# Patient Record
Sex: Female | Born: 1935 | Race: White | Hispanic: No | Smoking: Former smoker
Health system: Southern US, Community
[De-identification: ages and names within clinical notes are randomized; demographics above are authoritative.]

## PROBLEM LIST (undated history)

## (undated) ENCOUNTER — Emergency Department (HOSPITAL_COMMUNITY): Admission: EM | Payer: Medicare Other | Source: Home / Self Care

## (undated) DIAGNOSIS — I517 Cardiomegaly: Secondary | ICD-10-CM

## (undated) DIAGNOSIS — R55 Syncope and collapse: Secondary | ICD-10-CM

## (undated) DIAGNOSIS — R42 Dizziness and giddiness: Secondary | ICD-10-CM

## (undated) DIAGNOSIS — K219 Gastro-esophageal reflux disease without esophagitis: Secondary | ICD-10-CM

## (undated) DIAGNOSIS — T4145XA Adverse effect of unspecified anesthetic, initial encounter: Secondary | ICD-10-CM

## (undated) DIAGNOSIS — I2510 Atherosclerotic heart disease of native coronary artery without angina pectoris: Secondary | ICD-10-CM

## (undated) DIAGNOSIS — J45909 Unspecified asthma, uncomplicated: Secondary | ICD-10-CM

## (undated) DIAGNOSIS — R0602 Shortness of breath: Secondary | ICD-10-CM

## (undated) DIAGNOSIS — J40 Bronchitis, not specified as acute or chronic: Secondary | ICD-10-CM

## (undated) DIAGNOSIS — G2581 Restless legs syndrome: Secondary | ICD-10-CM

## (undated) DIAGNOSIS — D649 Anemia, unspecified: Secondary | ICD-10-CM

## (undated) DIAGNOSIS — J189 Pneumonia, unspecified organism: Secondary | ICD-10-CM

## (undated) DIAGNOSIS — E7800 Pure hypercholesterolemia, unspecified: Secondary | ICD-10-CM

## (undated) DIAGNOSIS — R350 Frequency of micturition: Secondary | ICD-10-CM

## (undated) DIAGNOSIS — I10 Essential (primary) hypertension: Secondary | ICD-10-CM

## (undated) DIAGNOSIS — J449 Chronic obstructive pulmonary disease, unspecified: Secondary | ICD-10-CM

## (undated) DIAGNOSIS — M1990 Unspecified osteoarthritis, unspecified site: Secondary | ICD-10-CM

## (undated) DIAGNOSIS — T8859XA Other complications of anesthesia, initial encounter: Secondary | ICD-10-CM

## (undated) DIAGNOSIS — C801 Malignant (primary) neoplasm, unspecified: Secondary | ICD-10-CM

## (undated) DIAGNOSIS — H9190 Unspecified hearing loss, unspecified ear: Secondary | ICD-10-CM

## (undated) DIAGNOSIS — I739 Peripheral vascular disease, unspecified: Secondary | ICD-10-CM

## (undated) DIAGNOSIS — I6529 Occlusion and stenosis of unspecified carotid artery: Secondary | ICD-10-CM

## (undated) HISTORY — PX: CARDIAC CATHETERIZATION: SHX172

## (undated) HISTORY — PX: TONSILLECTOMY: SUR1361

## (undated) HISTORY — PX: APPENDECTOMY: SHX54

## (undated) HISTORY — DX: Atherosclerotic heart disease of native coronary artery without angina pectoris: I25.10

## (undated) HISTORY — DX: Cardiomegaly: I51.7

## (undated) HISTORY — DX: Occlusion and stenosis of unspecified carotid artery: I65.29

## (undated) HISTORY — PX: BACK SURGERY: SHX140

## (undated) HISTORY — PX: CHOLECYSTECTOMY: SHX55

## (undated) HISTORY — PX: ABDOMINAL HYSTERECTOMY: SHX81

## (undated) HISTORY — DX: Peripheral vascular disease, unspecified: I73.9

## (undated) HISTORY — PX: FOOT SURGERY: SHX648

## (undated) HISTORY — PX: INNER EAR SURGERY: SHX679

## (undated) HISTORY — PX: COLONOSCOPY: SHX174

---

## 2003-01-11 ENCOUNTER — Encounter: Payer: Self-pay | Admitting: Neurosurgery

## 2003-01-11 ENCOUNTER — Encounter: Admission: RE | Admit: 2003-01-11 | Discharge: 2003-01-11 | Payer: Self-pay | Admitting: Neurosurgery

## 2003-01-25 ENCOUNTER — Encounter: Admission: RE | Admit: 2003-01-25 | Discharge: 2003-01-25 | Payer: Self-pay | Admitting: Neurosurgery

## 2003-01-25 ENCOUNTER — Encounter: Payer: Self-pay | Admitting: Neurosurgery

## 2004-08-10 HISTORY — PX: CORONARY ANGIOPLASTY: SHX604

## 2007-08-11 DIAGNOSIS — R55 Syncope and collapse: Secondary | ICD-10-CM

## 2007-08-11 HISTORY — DX: Syncope and collapse: R55

## 2009-03-12 ENCOUNTER — Ambulatory Visit (HOSPITAL_COMMUNITY): Admission: RE | Admit: 2009-03-12 | Discharge: 2009-03-13 | Payer: Self-pay | Admitting: Orthopedic Surgery

## 2009-03-12 ENCOUNTER — Encounter: Admission: RE | Admit: 2009-03-12 | Discharge: 2009-03-12 | Payer: Self-pay | Admitting: Orthopedic Surgery

## 2009-03-13 ENCOUNTER — Emergency Department (HOSPITAL_COMMUNITY): Admission: EM | Admit: 2009-03-13 | Discharge: 2009-03-13 | Payer: Self-pay | Admitting: Emergency Medicine

## 2009-06-19 ENCOUNTER — Ambulatory Visit (HOSPITAL_BASED_OUTPATIENT_CLINIC_OR_DEPARTMENT_OTHER): Admission: RE | Admit: 2009-06-19 | Discharge: 2009-06-19 | Payer: Self-pay | Admitting: Orthopedic Surgery

## 2010-08-10 HISTORY — PX: EYE SURGERY: SHX253

## 2010-11-12 LAB — POCT HEMOGLOBIN-HEMACUE: Hemoglobin: 12.7 g/dL (ref 12.0–15.0)

## 2010-11-15 LAB — BASIC METABOLIC PANEL
BUN: 16 mg/dL (ref 6–23)
Calcium: 9.3 mg/dL (ref 8.4–10.5)
GFR calc non Af Amer: >60 mL/min (ref >60)
Glucose, Bld: 108 mg/dL — ABNORMAL HIGH (ref 70–99)
Sodium: 140 mEq/L (ref 135–145)

## 2011-04-29 ENCOUNTER — Other Ambulatory Visit: Payer: Self-pay | Admitting: Orthopedic Surgery

## 2011-04-29 ENCOUNTER — Other Ambulatory Visit (HOSPITAL_COMMUNITY): Payer: Self-pay | Admitting: Orthopedic Surgery

## 2011-04-29 ENCOUNTER — Encounter (HOSPITAL_COMMUNITY): Payer: Medicare Other

## 2011-04-29 ENCOUNTER — Ambulatory Visit (HOSPITAL_COMMUNITY)
Admission: RE | Admit: 2011-04-29 | Discharge: 2011-04-29 | Disposition: A | Discharge status: Home / Self Care | Payer: Medicare Other | Source: Ambulatory Visit | Attending: Orthopedic Surgery | Admitting: Orthopedic Surgery

## 2011-04-29 DIAGNOSIS — J45909 Unspecified asthma, uncomplicated: Secondary | ICD-10-CM | POA: Insufficient documentation

## 2011-04-29 DIAGNOSIS — M1710 Unilateral primary osteoarthritis, unspecified knee: Secondary | ICD-10-CM | POA: Insufficient documentation

## 2011-04-29 DIAGNOSIS — J8410 Pulmonary fibrosis, unspecified: Secondary | ICD-10-CM | POA: Insufficient documentation

## 2011-04-29 DIAGNOSIS — Z01812 Encounter for preprocedural laboratory examination: Secondary | ICD-10-CM | POA: Insufficient documentation

## 2011-04-29 DIAGNOSIS — I10 Essential (primary) hypertension: Secondary | ICD-10-CM | POA: Insufficient documentation

## 2011-04-29 DIAGNOSIS — Z01818 Encounter for other preprocedural examination: Secondary | ICD-10-CM

## 2011-04-29 DIAGNOSIS — R599 Enlarged lymph nodes, unspecified: Secondary | ICD-10-CM | POA: Insufficient documentation

## 2011-04-29 LAB — CBC
MCH: 28.3 pg (ref 26.0–34.0)
MCHC: 33.3 g/dL (ref 30.0–36.0)
Platelets: 246 10*3/uL (ref 150–400)
RDW: 13.3 % (ref 11.5–15.5)

## 2011-04-29 LAB — COMPREHENSIVE METABOLIC PANEL
Albumin: 3.8 g/dL (ref 3.5–5.2)
Alkaline Phosphatase: 140 U/L — ABNORMAL HIGH (ref 39–117)
BUN: 17 mg/dL (ref 6–23)
Calcium: 9.7 mg/dL (ref 8.4–10.5)
Creatinine, Ser: 0.77 mg/dL (ref 0.50–1.10)
GFR calc Af Amer: >60 mL/min (ref >60)
Glucose, Bld: 73 mg/dL (ref 70–99)
Total Protein: 7.1 g/dL (ref 6.0–8.3)

## 2011-04-29 LAB — URINALYSIS, ROUTINE W REFLEX MICROSCOPIC
Glucose, UA: NEGATIVE mg/dL
Ketones, ur: NEGATIVE mg/dL
Leukocytes, UA: NEGATIVE
Nitrite: NEGATIVE
Specific Gravity, Urine: 1.011 (ref 1.005–1.030)
pH: 7 (ref 5.0–8.0)

## 2011-04-29 LAB — PROTIME-INR
INR: 1.05 (ref 0.00–1.49)
Prothrombin Time: 13.9 seconds (ref 11.6–15.2)

## 2011-04-29 LAB — SURGICAL PCR SCREEN: MRSA, PCR: POSITIVE — AB

## 2011-04-30 ENCOUNTER — Other Ambulatory Visit: Payer: Self-pay | Admitting: Orthopedic Surgery

## 2011-04-30 LAB — NO BLOOD PRODUCTS

## 2011-05-04 ENCOUNTER — Inpatient Hospital Stay (HOSPITAL_COMMUNITY)
Admission: RE | Admit: 2011-05-04 | Discharge: 2011-05-07 | DRG: 470 | Disposition: A | Discharge status: Skilled Nursing Facility | Payer: Medicare Other | Source: Ambulatory Visit | Attending: Orthopedic Surgery | Admitting: Orthopedic Surgery

## 2011-05-04 DIAGNOSIS — Z531 Procedure and treatment not carried out because of patient's decision for reasons of belief and group pressure: Secondary | ICD-10-CM

## 2011-05-04 DIAGNOSIS — E7800 Pure hypercholesterolemia, unspecified: Secondary | ICD-10-CM | POA: Diagnosis present

## 2011-05-04 DIAGNOSIS — H9319 Tinnitus, unspecified ear: Secondary | ICD-10-CM | POA: Diagnosis present

## 2011-05-04 DIAGNOSIS — H9190 Unspecified hearing loss, unspecified ear: Secondary | ICD-10-CM | POA: Diagnosis present

## 2011-05-04 DIAGNOSIS — I2510 Atherosclerotic heart disease of native coronary artery without angina pectoris: Secondary | ICD-10-CM | POA: Diagnosis present

## 2011-05-04 DIAGNOSIS — I10 Essential (primary) hypertension: Secondary | ICD-10-CM | POA: Diagnosis present

## 2011-05-04 DIAGNOSIS — Z7382 Dual sensory impairment: Secondary | ICD-10-CM

## 2011-05-04 DIAGNOSIS — H547 Unspecified visual loss: Secondary | ICD-10-CM | POA: Diagnosis present

## 2011-05-04 DIAGNOSIS — L259 Unspecified contact dermatitis, unspecified cause: Secondary | ICD-10-CM | POA: Diagnosis present

## 2011-05-04 DIAGNOSIS — K5730 Diverticulosis of large intestine without perforation or abscess without bleeding: Secondary | ICD-10-CM | POA: Diagnosis present

## 2011-05-04 DIAGNOSIS — M1710 Unilateral primary osteoarthritis, unspecified knee: Principal | ICD-10-CM | POA: Diagnosis present

## 2011-05-04 DIAGNOSIS — J45909 Unspecified asthma, uncomplicated: Secondary | ICD-10-CM | POA: Diagnosis present

## 2011-05-04 DIAGNOSIS — D62 Acute posthemorrhagic anemia: Secondary | ICD-10-CM | POA: Diagnosis not present

## 2011-05-04 DIAGNOSIS — Z9861 Coronary angioplasty status: Secondary | ICD-10-CM

## 2011-05-04 DIAGNOSIS — M810 Age-related osteoporosis without current pathological fracture: Secondary | ICD-10-CM | POA: Diagnosis present

## 2011-05-04 DIAGNOSIS — Z79899 Other long term (current) drug therapy: Secondary | ICD-10-CM

## 2011-05-04 DIAGNOSIS — Z01812 Encounter for preprocedural laboratory examination: Secondary | ICD-10-CM

## 2011-05-04 DIAGNOSIS — Z780 Asymptomatic menopausal state: Secondary | ICD-10-CM

## 2011-05-04 DIAGNOSIS — K219 Gastro-esophageal reflux disease without esophagitis: Secondary | ICD-10-CM | POA: Diagnosis present

## 2011-05-05 LAB — CBC
MCH: 28 pg (ref 26.0–34.0)
Platelets: 184 10*3/uL (ref 150–400)
RBC: 3.18 MIL/uL — ABNORMAL LOW (ref 3.87–5.11)
RDW: 12.9 % (ref 11.5–15.5)

## 2011-05-05 LAB — BASIC METABOLIC PANEL
CO2: 32 mEq/L (ref 19–32)
Calcium: 8.6 mg/dL (ref 8.4–10.5)
GFR calc Af Amer: >60 mL/min (ref >60)
GFR calc non Af Amer: >60 mL/min (ref >60)
Sodium: 138 mEq/L (ref 135–145)

## 2011-05-06 LAB — BASIC METABOLIC PANEL
CO2: 33 mEq/L — ABNORMAL HIGH (ref 19–32)
Chloride: 102 mEq/L (ref 96–112)
Potassium: 3.6 mEq/L (ref 3.5–5.1)
Sodium: 140 mEq/L (ref 135–145)

## 2011-05-06 LAB — CBC
MCV: 85.1 fL (ref 78.0–100.0)
Platelets: 176 10*3/uL (ref 150–400)
RBC: 3.23 MIL/uL — ABNORMAL LOW (ref 3.87–5.11)
WBC: 9.1 10*3/uL (ref 4.0–10.5)

## 2011-05-06 NOTE — Op Note (Signed)
NAMEZORINA, MALLIN NO.:  [id number]  MEDICAL RECORD NO.:  [id number]  LOCATION:  1609                         FACILITY:  [facility]  PHYSICIAN:  Ollen Gross, M.D.    DATE OF BIRTH:  December 02, 1929  DATE OF PROCEDURE: DATE OF DISCHARGE:                              OPERATIVE REPORT   PREOPERATIVE DIAGNOSIS:  Osteoarthritis, left knee.  POSTOPERATIVE DIAGNOSIS:  Osteoarthritis, left knee.  PROCEDURE:  Left total knee arthroplasty.  SURGEON:  Ollen Gross, M.D.  ASSISTANT:  Alexzandrew L. Perkins, P.A.C.  ANESTHESIA:  Spinal.  ESTIMATED BLOOD LOSS:  Minimal.  DRAIN:  Hemovac x1.  TOURNIQUET TIME:  34 minutes at 300 mmHg.  COMPLICATIONS:  None.  CONDITION:  Stable, recovering.  BRIEF CLINICAL NOTE:  Ms. Gramajo is a 77 year old female with advanced end-stage arthritis of the left knee with progressively worsening pain and dysfunction.  She has failed nonoperative management and presents for a left total knee arthroplasty.  She has bone-on-bone arthritis in the medial and patellofemoral compartments and knee with a varus deformity.  She has failed injections and analgesics.  PROCEDURE IN DETAIL:  After successful administration of spinal anesthetic, a tourniquet is placed high on her left thigh and her left lower extremity was prepped and draped in the usual sterile fashion. Extremities were wrapped in Esmarch, knee flexed, tourniquet inflated to 300 mmHg.  Midline incision was made with 10 blade through subcutaneous tissue to the level of the extensor mechanism.  A fresh blade is used to make a medial parapatellar arthrotomy.  Soft tissue on the proximal medial tibia, subperiosteally elevated to the joint line with the knife and into the semimembranosus bursa with a Cobb elevator.  Soft tissue laterally was elevated with attention being paid to avoid patellar tendon on tibial tubercle.  Patella was everted, knee flexed 90 degrees, and ACL and PCL  removed.  Drill was used create a starting hole in the distal femur, and the canal was thoroughly irrigated.  The 5-degree left valgus alignment guide is placed, and the distal femoral cutting block is pinned to remove 10 mm off the distal femur.  Distal femoral resection is made with an oscillating saw.  The tibia subluxed forward and the menisci removed.  Extramedullary tibial alignment guide is placed referencing proximally at the medial aspect of the tibial tubercle and distally along the second metatarsal axis and tibial crest.  The block is pinned to remove 2 mm off the more deficient medial side.  Tibial resection is made with an oscillating saw.  Size 2.5 is most appropriate tibial component and the proximal tibia was prepared with the modular drill and keel punch for the size 2.5.  Femoral sizing guide is placed, size 2.5 is most appropriate on the femur.  The rotation is marked off the epicondylar axis and confirmed by creating a rectangular flexion gap at 90 degrees.  The block is pinned in this rotation, and the anterior-posterior and chamfer cuts are made. Intercondylar block is placed and that cut was made.  Trial size 2.5 posterior stabilized femoral component was placed.  A 12.5 mm posterior stabilized rotating platform insert trial was placed.  With the 12.5 full extensions  achieved with excellent varus-valgus and anterior- posterior balance throughout full range of motion.  The patella was everted and thickness measured to be 21 mm.  Freehand resection was taken 12 mm, 35 template is placed, lug holes were drilled, trial patella was placed and it tracks normally.  Osteophytes were removed off the posterior femur with the trial in place.  All trials were removed, and the cut bone surfaces were prepared with pulsatile lavage.  Cement was mixed and once ready for implantation, a size 2.5 mobile bearing tibial tray, size 2.5 posterior stabilized femur, and 35 patella  were cemented into place.  The patella was held with a clamp.  Trial 12.5 mm inserts were placed, knee held in full extension, all extruded cement removed.  The cement is fully hardened, then a permanent 12.5 mm posterior stabilized rotating platform insert is placed in the tibial tray.  Wound was copiously irrigated with saline solution and the arthrotomy closed over Hemovac drain with interrupted #1 PDS.  Flexion against gravity is 135 degrees and patella tracks normally.  The tourniquet was released, total time of 34 minutes.  Subcu was closed with interrupted 2-0 Vicryl and subcuticular running 4-0 Monocryl. Catheter for the Marcaine pain pump was placed, and the pump was initiated.  Incision was cleaned and dried, and Steri-Strips and a bulky sterile dressing applied.  She was then awakened and transported to recovery in stable condition.  Please note that it was a medical necessity for a surgical assistant for this procedure in order to perform it in a safe and expeditious manner. Surgical assistant was providing retraction to the ligaments and vital neurovascular structures, and also positioning the leg to allow for anatomic placement of the prosthesis.     Ollen Gross, M.D.     FA/MEDQ  D:  05/04/2011  T:  05/04/2011  Job:  [id number]  Electronically Signed by Ollen Gross M.D. on 05/06/2011 10:34:54 AM

## 2011-05-11 HISTORY — PX: JOINT REPLACEMENT: SHX530

## 2011-05-20 NOTE — H&P (Signed)
[name], [name] NO.:  [id number]  MEDICAL RECORD NO.:  [id number]  LOCATION:  1609                         FACILITY:  [facility]  PHYSICIAN:  Ollen Gross, M.D.    DATE OF BIRTH:  1930-06-02  DATE OF ADMISSION:  05/04/2011 DATE OF DISCHARGE:                             HISTORY & PHYSICAL   CHIEF COMPLAINT:  Left knee pain.  HISTORY OF PRESENT ILLNESS:  The patient is a 77 year old female who has been seen by Dr. Lequita Halt for ongoing left knee pain.  She has been treated conservatively for her arthritis in the left knee for quite some time now.  She has pain in both knees, but the left is more symptomatic and problematic than the right.  She has undergone a series of Synvisc injections, which did not help.  X-rays at this point show that she is end-stage and bone-on-bone.  They are worse on the left than the right. It is felt she would benefit from undergoing surgical intervention. Risks and benefits have been discussed.  She elected to proceed with surgery.  ALLERGIES:  No known drug allergies.  CURRENT MEDICATIONS:  Torsemide, potassium chloride, metoprolol, ropinirole, Singulair, Nexium, generic Ambien, atorvastatin, oxycodone, ciprofloxacin, and Nitrostat.  PAST MEDICAL HISTORY: 1. Impaired vision, wears contacts. 2. Impaired hearing, uses hearing aids. 3. Tinnitus. 4. Occasional vertigo. 5. Hypertension. 6. Coronary arterial disease, status post stenting. 7. Hypercholesterolemia. 8. Varicose veins. 9. Reflux disease. 10.Diverticulosis. 11.Osteoporosis. 12.Postmenopausal. 13.Eczema. 14.Childhood illnesses of measles.  PAST SURGICAL HISTORY:  Hysterectomy in 1974, gallbladder surgery in 1990, hammertoes in right foot in November 2009 and again in 2010, and bilateral ear surgery in around 2000.  FAMILY HISTORY:  Mother suicide at age 59.  Father with fall, job accident.  SOCIAL HISTORY:  Widowed.  Retired.  Past smoker, quit in 1974.   No alcohol.  Lives alone.  She does have a caregiver lined up, but also she would like to look into a rehab facility first.  She would like to look into the [facility] and [facility].  She is Jehovah's Witnesses and will not accept any blood products.  REVIEW OF SYSTEMS:  GENERAL:  No fever, chills, or night sweats.  NEURO: No seizure, syncope, or paralysis.  She does have ringing in her ears and some hearing loss.  RESPIRATORY:  No shortness of breath, productive cough, or hemoptysis.  CARDIOVASCULAR:  No chest pain, angina, or orthopnea.  GI:  No nausea, vomiting, diarrhea, or constipation.  GU: No dysuria, hematuria, or discharge.  MUSCULOSKELETAL:  Knee pain.  PHYSICAL EXAMINATION:  VITAL SIGNS:  Pulse 56, respirations 14, blood pressure 114/56. GENERAL:  A 77 year old white female, well nourished, well developed, in no acute distress.  She is alert, oriented, cooperative, pleasant.  Good historian. HEENT:  Normocephalic, atraumatic.  Pupils are round and reactive.  EOMs intact. NECK:  Supple with a trace right carotid bruit. CHEST:  Clear. HEART:  Bradycardic rhythm.  Pulse around 56, regular rhythm, otherwise no murmur.  S1, S2 noted. ABDOMEN:  Soft, nontender.  Bowel sounds present. RECTAL/BREASTS/GENITALIA:  Not done, not pertinent to present illness. EXTREMITIES:  Left knee, motor function is intact, sensation is  intact, noted to have crepitus on passive range of motion.  IMPRESSION:  Osteoarthritis, left knee.  PLAN:  The patient admitted to [facility] to undergo a left total knee replacement arthroplasty.  Surgery will be performed by Dr. Ollen Gross.     Alexzandrew L. Julien Girt, P.A.C.   ______________________________ Ollen Gross, M.D.    ALP/MEDQ  D:  05/06/2011  T:  05/07/2011  Job:  [id number]  cc:   Gigi Gin _____, NP  [facility]  Electronically Signed by Patrica Duel P.A.C. on 05/07/2011 11:20:52  AM Electronically Signed by Ollen Gross M.D. on 05/20/2011 11:17:00 AM

## 2011-05-20 NOTE — Discharge Summary (Signed)
NAMEKHILYNN, BORNTREGER NO.:  [id number]  MEDICAL RECORD NO.:  [id number]  LOCATION:  1609                         FACILITY:  [facility]  PHYSICIAN:  Ollen Gross, M.D.    DATE OF BIRTH:  [birth year]  DATE OF ADMISSION:  05/04/2011 DATE OF DISCHARGE:  05/07/2011                        DISCHARGE SUMMARY - REFERRING   ADMITTING DIAGNOSES: 1. Osteoarthritis, left knee. 2. Impaired vision, wears contacts. 3. Impaired hearing,  uses hearing aids. 4. Tinnitus. 5. Occasional vertigo. 6. Hypertension. 7. Coronary arterial disease status post cardiac stenting. 8. Hypercholesterolemia. 9. Varicose veins. 10.Reflux disease. 11.Diverticulosis. 12.Osteoporosis. 13.Postmenopausal. 14.Eczema. 15.Childhood illnesses of measles.  DISCHARGE DIAGNOSES: 1. Osteoarthritis, left knee status post left total knee replacement     arthroplasty. 2. Postoperative acute blood loss anemia, did not require transfusion. 3. Impaired vision, wears contacts. 4. Impaired hearing,  uses hearing aids. 5. Tinnitus. 6. Occasional vertigo. 7. Hypertension. 8. Coronary arterial disease status post cardiac stenting. 9. Hypercholesterolemia. 10.Varicose veins. 11.Reflux disease. 12.Diverticulosis. 13.Osteoporosis. 14.Postmenopausal. 15.Eczema. 16.Childhood illnesses of measles.  PROCEDURE:  May 04, 2011, left total knee.  SURGEON:  Dr. Homero Fellers Radley Teston.  ASSISTANT:  Alexzandrew L. Perkins, P.A.C.  ANESTHESIA:  Spinal anesthesia.  TOURNIQUET TIME:  34 minutes.  CONSULTS:  None.  BRIEF HISTORY:  [name] is a 77 year old female with advanced arthritis of left knee with progressive worsening pain and dysfunction, failed nonoperative management, now presents for a left total knee.  She has bone-on-bone arthritis and failed injection and analgesics.  LABORATORY DATA:  Preop CBC showed a hemoglobin 13.0, hematocrit 39.0, white cell count 6.4, platelets 246.  PT/INR 13.9 and 1.05  with a PTT of 42.  Chem panel on admission slightly elevated CO2 of 33, slightly elevated alk phos of 140.  Remaining Chem panel within normal limits. Preop UA negative.  Nasal swabs were positive for staph aureus and positive for MRSA.  Serial CBCs followed for 2 days.  Hemoglobin dropped down to 8.9 and stabilized and was last noted at 9.0 and 27.5.  Serial BMETs were followed for 48 hours.  Electrolytes remained within normal limits.  Chest x-ray on April 29, 2011, old granulomatous disease, otherwise no significant abnormalities.  EKG, April 2012, sinus bradycardia.  HOSPITAL COURSE:  The patient admitted to [facility], taken to the OR, underwent the above-stated procedure without complication.  The patient tolerated procedure well, later transferred to recovery room in the orthopedic floor, started on p.o. and IV analgesic pain control following surgery.  She had a fair amount of pain through the night, but still a little bit better on the morning of day #1.  She had excellent urinary output.  Hemoglobin was down to 8.9.  She was asymptomatic with this.  She was started back on all of her home meds.  She was on oxycodone preoperatively, so we increased the dose of the oral oxycodone postoperatively to give better pain control.  This actually helped her situation.  She was otherwise doing pretty well, started getting up out of bed on day #1 and by day #2, she was already walking in the hallway, walking about 10 feet.  We changed the dressing on day #2 and weaned over  to p.o. meds.  Incision looked good.  No signs of infection. Continue to work with therapy each day.  We got social work involved. She wanted to look into the skilled facility over in [facility] where she was from.  They are working on bed availability and  when she was seen on Thursday, on May 07, 2011, it was felt that as long as the bed was available, she was doing fine medically from  postoperative standpoint.  She will be transferred over that time.  DISCHARGE/PLAN: 1. The patient will be transferred over to [facility]. 2. Discharge diagnoses, please see above. 3. Discharge medications, current medications at the time of transfer     include:     a.     Xarelto 10 mg p.o. daily for 3 weeks, then discontinue the      Xarelto.     b.     Colace 100 mg p.o. b.i.d.     c.     Singulair 10 mg p.o. at bedtime.     d.     ReQuip 2 mg at bedtime.     e.     Potassium chloride 20 mEq 1 tablet p.o. b.i.d.     f.     Toprol-XL 25 mg at bedtime.     g.     Demadex 20 mg twice a day.     h.     Meclizine 25 mg p.o. t.i.d.     i.     Atorvastatin 10 mg at bedtime.     j.     Nu-Iron 150 mg p.o. b.i.d. for 3 weeks, then go to once a      day.     k.     Nexium 40 mg every morning.     l.     Robaxin 500 mg p.o. every 6-8 hours p.r.n. spasm.     m.     Tylenol 325 one or two every 4-6 hours needed for mild pain,      temperature or headache.     n.     Combivent inhaler 2 puffs every 4 hours p.r.n.     o.     Oxycodone 10 mg one or two every 4-6 hours as needed for      moderate pain.  DIET:  Heart-healthy diet.  ACTIVITY:  She is weightbearing as tolerated, total knee protocol. Continue gait training, ambulation, ADLs, range of motion, strengthening exercises to the left knee.  She may start showering; however, do not submerge incision under water.  FOLLOWUP:  She is to follow up with Dr. Lequita Halt in the office in approximately 2 weeks from the date of surgery.  Please contact the office at [phone] and help arrange appointment for followup care of this patient.  DISPOSITION:  Plan is to go to the [facility]  CONDITION ON DISCHARGE:  Improving at time of dictation.     Alexzandrew L. Julien Girt, P.A.C.   ______________________________ Ollen Gross, M.D.    ALP/MEDQ  D:  05/07/2011  T:  05/07/2011  Job:  [id number]  cc:   [facility]  [facility]  Electronically Signed by Patrica Duel P.A.C. on 05/07/2011 11:20:57 AM Electronically Signed by Ollen Gross M.D. on 05/20/2011 11:16:57 AM

## 2012-03-22 ENCOUNTER — Encounter (HOSPITAL_COMMUNITY): Payer: Self-pay | Admitting: Respiratory Therapy

## 2012-03-25 NOTE — Pre-Procedure Instructions (Signed)
20 TICIA VIRGO  03/25/2012   Your procedure is scheduled on:  Wednesday April 06, 2012.  Report to [facility] at 0630 AM.  Call this number if you have problems the morning of surgery: [phone]   Remember:   Do not eat food or drink:After Midnight.    Take these medicines the morning of surgery with A SIP OF WATER: Albuterol inhaler if needed for shortness of breath, Esomeprazole (Nexium), Oxycodone if needed for pain, Pilocarpine (Salagen)   Do not wear jewelry, make-up or nail polish.  Do not wear lotions, powders, or perfumes.   Do not shave 48 hours prior to surgery. .  Do not bring valuables to the hospital.  Contacts, dentures or bridgework may not be worn into surgery.  Leave suitcase in the car. After surgery it may be brought to your room.  For patients admitted to the hospital, checkout time is 11:00 AM the day of discharge.   Patients discharged the day of surgery will not be allowed to drive home.  Name and phone number of your driver:   Special Instructions: CHG Shower Use Special Wash: 1/2 bottle night before surgery and 1/2 bottle morning of surgery.   Please read over the following fact sheets that you were given: Pain Booklet, Coughing and Deep Breathing, Blood Transfusion Information, MRSA Information and Surgical Site Infection Prevention

## 2012-03-28 ENCOUNTER — Encounter (HOSPITAL_COMMUNITY)
Admission: RE | Admit: 2012-03-28 | Discharge: 2012-03-28 | Disposition: A | Discharge status: Home / Self Care | Payer: Medicare Other | Source: Ambulatory Visit | Attending: Orthopedic Surgery | Admitting: Orthopedic Surgery

## 2012-03-28 ENCOUNTER — Encounter (HOSPITAL_COMMUNITY)
Admission: RE | Admit: 2012-03-28 | Discharge: 2012-03-28 | Disposition: A | Discharge status: Home / Self Care | Payer: Medicare Other | Source: Ambulatory Visit | Attending: Physician Assistant | Admitting: Physician Assistant

## 2012-03-28 ENCOUNTER — Encounter (HOSPITAL_COMMUNITY): Payer: Self-pay

## 2012-03-28 HISTORY — DX: Gastro-esophageal reflux disease without esophagitis: K21.9

## 2012-03-28 HISTORY — DX: Anemia, unspecified: D64.9

## 2012-03-28 HISTORY — DX: Restless legs syndrome: G25.81

## 2012-03-28 HISTORY — DX: Unspecified hearing loss, unspecified ear: H91.90

## 2012-03-28 HISTORY — DX: Unspecified asthma, uncomplicated: J45.909

## 2012-03-28 HISTORY — DX: Frequency of micturition: R35.0

## 2012-03-28 HISTORY — DX: Pure hypercholesterolemia, unspecified: E78.00

## 2012-03-28 HISTORY — DX: Dizziness and giddiness: R42

## 2012-03-28 HISTORY — DX: Unspecified osteoarthritis, unspecified site: M19.90

## 2012-03-28 HISTORY — DX: Other complications of anesthesia, initial encounter: T88.59XA

## 2012-03-28 HISTORY — DX: Syncope and collapse: R55

## 2012-03-28 HISTORY — DX: Chronic obstructive pulmonary disease, unspecified: J44.9

## 2012-03-28 HISTORY — DX: Adverse effect of unspecified anesthetic, initial encounter: T41.45XA

## 2012-03-28 HISTORY — DX: Pneumonia, unspecified organism: J18.9

## 2012-03-28 HISTORY — DX: Bronchitis, not specified as acute or chronic: J40

## 2012-03-28 HISTORY — DX: Essential (primary) hypertension: I10

## 2012-03-28 HISTORY — DX: Shortness of breath: R06.02

## 2012-03-28 LAB — CBC
HCT: 36.6 % (ref 36.0–46.0)
MCV: 83.6 fL (ref 78.0–100.0)
RBC: 4.38 MIL/uL (ref 3.87–5.11)
RDW: 13 % (ref 11.5–15.5)
WBC: 5.9 10*3/uL (ref 4.0–10.5)

## 2012-03-28 LAB — PROTIME-INR
INR: 1 (ref 0.00–1.49)
Prothrombin Time: 13.4 seconds (ref 11.6–15.2)

## 2012-03-28 LAB — COMPREHENSIVE METABOLIC PANEL
AST: 21 U/L (ref 0–37)
Albumin: 3.7 g/dL (ref 3.5–5.2)
Alkaline Phosphatase: 146 U/L — ABNORMAL HIGH (ref 39–117)
BUN: 22 mg/dL (ref 6–23)
CO2: 33 mEq/L — ABNORMAL HIGH (ref 19–32)
Chloride: 99 mEq/L (ref 96–112)
Creatinine, Ser: 1 mg/dL (ref 0.50–1.10)
GFR calc non Af Amer: 52 mL/min — ABNORMAL LOW (ref >90)
Potassium: 4 mEq/L (ref 3.5–5.1)
Total Bilirubin: 0.3 mg/dL (ref 0.3–1.2)

## 2012-03-28 LAB — SURGICAL PCR SCREEN: Staphylococcus aureus: POSITIVE — AB

## 2012-03-28 NOTE — H&P (Signed)
[name] 03/28/2012 8:51 AM Location: [facility] Patient #: [id number] DOB: [birth year] Married / Language: [place] / Race: White Female   History of Present Illness(Robyn Helbling Dierdre Highman, [name]; 03/28/2012 8:52 AM) The patient is a 78 year old female who comes in today for a preoperative History and Physical. The patient is scheduled for a XLIF L3-4 with poterior instrumentation for a grade 3 degenerative slip with stenosis L3-4 to be performed by Dr. Debria Garret D. Shon Baton, MD at [facility] on Wednesday, April 06, 2012 at 0830 . Please see the Harris record for complete dictated history and physical.    Allergies(Robyn Ode J Benton Tooker, [name]; 03/28/2012 11:17 AM) Iodinated Contrast   Social History([name]; 03/28/2012 11:18 AM) Tobacco use. Never smoker. Alcohol use. never consumed alcohol Children. 2 Current work status. 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 no Exercise. Exercises never Illicit drug use. no Living situation. Lives alone. live alone Marital status. Widowed. widowed Pain Contract. no Tobacco / smoke exposure. no No alcohol use   Medication History([name]; 03/28/2012 11:20 AM) Percocet (10-325MG  Tablet, 1 Oral three times daily, as needed, Taken starting 07/29/2011) Active. Norco (10-325MG  Tablet, 1 Tablet Oral four times daily, as needed, Taken starting 07/22/2011) Active. Methocarbamol (500MG  Tablet, 1 Oral every six hours, as needed) Active. Potassium Chloride CR ( Tablet ER, 2 Oral daily) Active. Robaxin (500MG  Tablet, Oral) Active. Toprol XL (25MG  Tablet ER 24HR, Oral) Active. Fish Oil (1000MG  Capsule, 4 Oral daily) Active. Torsemide (20MG  Tablet, 1 Oral two times daily) Active. NexIUM (40MG  Packet, 1 Oral daily) Active. Singulair (10MG  Tablet, 1 Oral at bedtime) Active. ROPINIRole HCl (1MG  Tablet, 2 Oral at bedtime) Active. Lipitor (10MG  Tablet, 1 Oral daily)  Active. Zolpidem Tartrate (10MG  Tablet, 1 Oral daily) Active.   Past Surgical History(Robyn Neider J [facility], [name]; 03/28/2012 10:32 AM) Hysterectomy (not due to cancer) - Complete. 1974 Cholecystectomy. 1995 Cataract Extraction-Bilateral. 05/2011 and 06/2011 Hammer toe Right. 2009 Knee Replacement, Total. Left. 2012 Dr. Despina Hick   Other Problems(Robyn Galik J [facility], [name]; 03/28/2012 10:38 AM) Asthma Hypertension Heart disease Diverticulitis Of Colon   Review of Systems(Robyn Dumire J [facility], [name]; 03/28/2012 11:25 AM) General:Not Present- Chills, Fever, Night Sweats, Appetite Loss, Fatigue, Feeling sick, Weight Gain and Weight Loss. Skin:Not Present- Itching, Rash, Skin Color Changes, Ulcer, Psoriasis and Change in Hair or Nails. HEENT:Present- Hearing problems. Not Present- Sensitivity to light, Nose Bleed and Ringing in the Ears. Neck:Not Present- Swollen Glands and Neck Mass. Respiratory:Not Present- Snoring, Chronic Cough, Bloody sputum and Dyspnea. Cardiovascular:Present- Shortness of Breath and Leg Cramps. Not Present- Chest Pain, Swelling of Extremities and Palpitations. Gastrointestinal:Present- Heartburn. Not Present- Bloody Stool, Abdominal Pain, Vomiting, Nausea and Incontinence of Stool. Female Genitourinary:Present- Nocturia. Not Present- Blood in Urine, Menstrual Irregularities, Frequency and Incontinence. Musculoskeletal:Present- Muscle Pain, Joint Stiffness, Joint Pain and Back Pain. Not Present- Muscle Weakness and Joint Swelling. Neurological:Present- Tingling and Numbness. Not Present- Burning, Tremor, Headaches and Dizziness. Psychiatric:Not Present- Anxiety, Depression and Memory Loss. Endocrine:Present- Excessive Thirst. Not Present- Cold Intolerance, Heat Intolerance and Excessive hunger. Hematology:Not Present- Abnormal Bleeding, Anemia, Blood Clots and Easy Bruising.   Vitals(Robyn More J Frederica Chrestman, [name]; 03/28/2012 10:17 AM) 03/28/2012 10:17 AM Weight: 150 lb  Height: 60 in Body Surface Area: 1.7 m Body Mass Index: 29.29 kg/m Pulse: 65 (Regular) BP: 153/64 (Sitting, Left Arm, Standard)    Physical Exam([name] Lemme J Abhay Godbolt, [name]; 03/28/2012 11:05 AM) The physical exam findings are as follows:   General General Appearance- pleasant. Not in acute distress. Orientation-  Oriented X3. Build & Nutrition- Well nourished and Well developed. Posture- Normal posture. Gait- Slow and cautious (actually presents in a wheelchair). Mental Status- Alert.   Integumentary Lumbar Spine- Deformity is present (scoliotic curvature on inspection).   Head and Neck Neck Global Assessment- supple. no lymphadenopathy and no nucchal rigidty.   Eye Pupil- Bilateral- Normal, Direct reaction to light normal, Equal and Regular. Motion- Bilateral- EOMI.   Chest and Lung Exam Auscultation: Breath sounds:- Clear.   Cardiovascular Auscultation:Rhythm- Regular rate and rhythm. Heart Sounds- Normal heart sounds.   Abdomen Palpation/Percussion:Palpation and Percussion of the abdomen reveal - Non Tender, No Rebound tenderness and Soft.   Peripheral Vascular Lower Extremity:Inspection- Bilateral- Inspection Normal. Palpation:Posterior tibial pulse- Bilateral- 1+. Dorsalis pedis pulse- Bilateral- 1+.   Neurologic Sensation:Lower Extremity- Bilateral- sensation is intact in the lower extremity. Reflexes:Patellar Reflex- Bilateral- 1+. Achilles Reflex- Bilateral- 1+. Babinski- Bilateral- Babinski not present. Clonus- Bilateral- clonus not present. Hoffman's Sign- Bilateral- Hoffman's sign not present. Testing:Seated Straight Leg Raise- Bilateral- Seated straight leg raise positive (right leg).   Musculoskeletal Spine/Ribs/Pelvis Lumbosacral Spine:Inspection and Palpation- Tenderness- generalized. bony and soft tissue palpation of the lumbar spine and SI joint does not recreate their typical  pain. Strength and Tone: Strength:Hip Flexion- Bilateral- 5/5. Knee Extension- Bilateral- 5/5. Knee Flexion- Bilateral- 5/5. Ankle Dorsiflexion- Bilateral- 5/5. Ankle Plantarflexion- Bilateral- 5/5. ROM- Flexion- severely decreased range of motion and painful. Extension- severely decreased range of motion and painful. Pain:- extension is more painful than flexion. Waddell's Signs- no Waddell's signs present. Lower Extremity Range of Motion:- No true hip, knee or ankle pain with range of motion. Gait and Station:Assistive Devices- no assistive devices.   Assessment & Plan(Otoniel Myhand J [facility], [name]; 03/28/2012 10:49 AM) Lumbar disc degeneration (722.52)  Note: Unfortunately conservative measures consisting of observation, activity modification, oral pain medication and injection therapy have failed to alleviate her symptoms and given the ongoing nature of her pain and the significant decrease in her quality of life, she wishes to proceed with surgery. Risks/benefits/alternatives to the procedure/expectations following the procedure have been reviewed with the patient by Dr. Shon Baton. She understands that the goal of surgery is to reduce not eliminate her pain. She understands the risks and the goals.  MRI of the lumbar spine dated 03/15/12 demonstrates prominent bilateral facet degenerative disease, ligamentum hypertrophy and a 9mm anterior slip of L3. Multifactorial spinal stenosis with foraminal narrowing. Please see the report for the remaining specifics.   She is scheduled to complete her pre-op Harris requirements today. She has been medically cleared for the procedure by [name], [facility]. Please see the scanned clearance form in the patients office chart for the specifics. She has been fitted for a lumbar corsett brace. She knows to bring this with her the morning of surgery.   All of her questions have been encouraged, addressed and answered.  Plan, at this time, is to proceed with surgery as scheduled.   Signed electronically by Gwinda Maine, [name] (03/28/2012 11:25 AM)   [name] 03/01/2012 1:49 PM Location: [facility] Patient #: [id number] DOB: November 18, 1929 Married / Language: [place] / Race: White Female   History of Present Illness(Sharon Gillian Shields; 03/01/2012 1:56 PM) The patient is a 78 year old female who presents today for follow up of their back. The patient is being followed for their back pain. They are now 2 year(s) out from when symptoms began (became worse). Symptoms reported today include: pain, pain at night, giving way, numbness, pain with  lying, pain with sitting and pain with standing. The patient states that they are doing poorly. The following medication has been used for pain control: Oxycodone (PCP). The patient reports their current pain level to be moderate to severe. The patient presents today following Dexa scan done 12/31/11 (and MRI ). The patient has not gotten any relief of their symptoms with Cortisone injections or physical therapy.    Subjective Transcription(DAHARI Sheela Stack, MD; 03/04/2012 1:28 PM)  She returns today for follow up. I last saw her in 2012. At this point in time she continues to state that her quality of life is next to nothing because of her severe spine pain. She was once a very active, outgoing woman, and over the last 2-3 years her quality of life has significantly deteriorated. She has had a total knee replacement recently but still has significant problems because of her back pain. She states that this pain is causing her to require medications. She can no longer independently ambulate, and she is starting to have to use a wheelchair because of the pain. As a result of this, she presents to me today for further evaluation and treatment.    I have reviewed her Dexascan. She has a normal bone density scan, no increased risk for fracture. This was  done on Dec 31, 2011. She's got a T score average of .9. She's also had an MRI that was done in February 2013 and plain x-rays show an anterior listhesis at L3-4 with marked DDD and facet arthrosis. There is also a scoliosis, apex to the left.    The MRI done on 09-15-11 compared to her previous one done on 06-08-2011 demonstrates ongoing severe degenerative disc disease with an anterior listhesis, Grade 2, borderline 3, at L3-4 with marked spinal stenosis and facet arthrosis.    Allergies(Sharon Gillian Shields; 03/01/2012 1:50 PM) SULFA. 01/21/2009 Latex Iodinated Contrast   Social History(Sharon Gillian Shields; 03/01/2012 1:50 PM) Tobacco use. Former smoker. Quit 1974 former smoker; smoke(d) 1/2 pack(s) per day   Medication History(Sharon J Roxan Hockey; 03/01/2012 1:50 PM) Potassium Chloride CR ( Tablet ER, 2 Oral daily) Active. Robaxin (500MG  Tablet, Oral) Active. Toprol XL (25MG  Tablet ER 24HR, Oral) Active. Fish Oil (1000MG  Capsule, 4 Oral daily) Active. Torsemide (20MG  Tablet, 1 Oral two times daily) Active. NexIUM (40MG  Packet, 1 Oral daily) Active. Singulair (10MG  Tablet, 1 Oral at bedtime) Active. ROPINIRole HCl (1MG  Tablet, 2 Oral at bedtime) Active. Lipitor (10MG  Tablet, 1 Oral daily) Active. Zolpidem Tartrate (10MG  Tablet, 1 Oral daily) Active.   Objective Transcription(DAHARI Sheela Stack, MD; 03/04/2012 1:28 PM)  On clinical exam, she's a pleasant woman, who apppears younger than her stated age, in obvious distress. She has an obvious scoliotic deformity of the lumbar spine, pain with extension of the spine, rotation, minimal discomfort with forward flexion. She ambulates with a walker, but she has no gross motor deficits in the lower extremity. Well-healed total knee scar on the left side. She has 5/5 strength at the hip flexor, quad, hamstring, EHL, tibialis anterior, gastrocnemius. Intact, but diminished, peripheral pulses.    Assessment & Plan(Sharon Gillian Shields; 03/01/2012 3:16 PM) Unspecified Diagnosis Current Plans l MRI Lumbar Spine (19147) [facility])   Assessments Transcription(DAHARI D BROOKS, MD; 03/04/2012 1:28 PM)  At this point in time, I do believe the L3-4 level is the principal source of pain and discomfort.    Plans Transcription(DAHARI Sheela Stack, MD; 03/04/2012 1:28 PM)  The patient was last seen by me on  08-06-11. At that point in time, we had discussed surgical intervention, and she would like to proceed with this. I think the patient's principal pain source is stenosis at 3-4 and instability at 3-4. I do believe, with a lateral interbody fusion, we could address both the instability and indirectly address the stenosis. I would then do a supplemental posterior fixation. I believe this provides the most efficacious way of addressing her pathology. The risks, as I explained them to her, include infection, bleeding, nerve damage, death, stroke, paralysis, injury to the lumbar plexus which would cause significant left leg weakness and need for wheelchair, ongoing or worse pain, injury to the bowel or bladder, bleeding, blood clots, nerve damage, worse pain. She has expressed an understanding for this and would like to still proceed with surgery which I think is reasonable. We will need preop. medical clearance. Once we have this, we will proceed with surgery. She will check with her pastor and religious leader concerning blood transfusion, as she is a Scientist, product/process development.    I have reviewed the actual MRI reports. On the MRI done of her hip done 09-15-11, there is moderate right-sided trochanteric bursitis, moderate hip joint degenerative changes bilaterally, no acute bony findings or destructive bony changes. She does have a slight anterior listhesis.    The MRI of her lumbar spine from 05/2011 demonstrates a slight 2 mm. subluxation of L1 on L2 and a similar subluxation of L2 on L3. There is a  significant subluxation of 3 on 4. There is borderline moderate stenosis at L2-3 from a mild disc bulge. There seems to be a facet fusion at L4-5 with significant DDD and a slight anterior listhesis. At this point in time, the most prominent findings are at L3-4.    Since we are proceeding with surgery, I do want to get an updated MRI of her lumbar spine. We will go ahead and start the process for surgical clearance as well as get a new MRI.    Miscellaneous Transcription(DAHARI Sheela Stack, MD; 03/04/2012 1:28 PM)  Venita Lick, M. D./slk    T: 03-04-12  D: 03-01-12      Signed electronically by Alvy Beal, MD (03/04/2012 1:48 PM)

## 2012-03-28 NOTE — Progress Notes (Signed)
[name] states that she had cardiac work up in [facility] in 2012.  I re- faxed request to [facility].

## 2012-03-28 NOTE — Progress Notes (Signed)
Patient is a Jehovah witness and refused any blood products. Refusal form faxed per protocol.

## 2012-03-28 NOTE — Progress Notes (Signed)
Patient informed Nurse that she had a stress test and cardiac cath at [facility] in [place], [place]. Records will be requested. PCP is Jacqlyn Krauss at [facility] in [place], [place]. Will request LOV from Physician. Patient also had a sleep study but stated "it was all right" patient denied wearing a BiPAP or CPAP machine at bedtime.

## 2012-04-01 ENCOUNTER — Other Ambulatory Visit (HOSPITAL_COMMUNITY): Payer: Medicare Other

## 2012-04-04 ENCOUNTER — Encounter (HOSPITAL_COMMUNITY): Payer: Self-pay | Admitting: Vascular Surgery

## 2012-04-04 NOTE — Consult Note (Addendum)
Anesthesia chart review: Patient is an 78 year old female scheduled for extreme lateral interbody fusion, lumbar 3-4, with posterior instrumentation on 04/06/2012 by Dr. Shon Baton.  History includes former smoker, GERD, RLS, hypercholesterolemia, anemia, COPD, PNA, HOH, CAD s/p proximal LAD stent '06, syncope '09, s/p left TKA 04/2011.  Her anesthesia history includes post-operative hypotension and difficult waking up from anesthesia.  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   Her PCP is Tommas Olp, NP, who by notes, medically cleared patient for this procedure.  (I've asked the Short Stay staff to request a copy of her clearance note from Dr. Dairl Ponder office.)  EKG on 03/28/12 showed NSR.  Her last stress test was on 03/03/11 at [facility]) and showed a small, mild, fixed mid-inferior lateral defect secondary to attenuation or possibly scar, mild to moderate diaphragmatic attenuation, sensitivity and specificity is reduced by the noted attenuation, no evidence of myocardial ischemia, EF 65% with normal wall motion.  (This stress test was done prior to her TKA in 04/2011 after Charmian Muff, NP referred her to [facility] Cardiologist Dr. Joneen Roach for chest pain.)  I requested more recent Cardiology notes, if available. (Update: no more recent Cardiology notes at [facility].)  Echo done on 06/29/11 showed LVH, normal LVEF 60-65%, degenerative mitral valve disease with trivial MR, trivial AR, moderate pulmonary hypertension, trivial pulmonary regurgitation, normal RV contractile performance, mild TR.  Carotid duplex on 06/29/11 [facility]) showed 50-70% proximal left ICA stenosis and < 50% right ICA stenosis.   CXR on 03/28/12 showed: 1. No acute findings.  2. Moderate hiatal hernia.   Labs noted.  Cr 1.00.  H/H 11.9/36.6. (Hgb has been 12-13 since 2010 and dropped to 8.9 following her TKA in September 2012).  PT/INR WNL.    I'll review additional records as available; however, since her EKG is WNL, she had  a stress test just over 1 year ago that did not show evidence of ischemia, and her H/H are relatively stable, then anticipate she can proceed as planned.  She will be evaluated by her Anesthesiologist on the day of surgery.  Shonna Chock, PA-C

## 2012-04-05 MED ORDER — LACTATED RINGERS IV SOLN
INTRAVENOUS | Status: DC
  Administered 2012-04-06: 15:00:00 via INTRAVENOUS

## 2012-04-05 MED ORDER — CEFAZOLIN SODIUM-DEXTROSE 2-3 GM-% IV SOLR
2.0000 g | INTRAVENOUS | Status: AC
  Administered 2012-04-06: 2 g via INTRAVENOUS
  Filled 2012-04-05 (×2): qty 50

## 2012-04-06 ENCOUNTER — Inpatient Hospital Stay (HOSPITAL_COMMUNITY)
Admission: RE | Admit: 2012-04-06 | Discharge: 2012-04-09 | DRG: 458 | Disposition: A | Discharge status: Skilled Nursing Facility | Payer: Medicare Other | Source: Ambulatory Visit | Attending: Orthopedic Surgery | Admitting: Orthopedic Surgery

## 2012-04-06 ENCOUNTER — Inpatient Hospital Stay (HOSPITAL_COMMUNITY): Payer: Medicare Other | Admitting: Vascular Surgery

## 2012-04-06 ENCOUNTER — Encounter (HOSPITAL_COMMUNITY): Payer: Self-pay | Admitting: Vascular Surgery

## 2012-04-06 ENCOUNTER — Inpatient Hospital Stay (HOSPITAL_COMMUNITY): Payer: Medicare Other

## 2012-04-06 ENCOUNTER — Encounter (HOSPITAL_COMMUNITY)
Admission: RE | Disposition: A | Discharge status: Skilled Nursing Facility | Payer: Self-pay | Source: Ambulatory Visit | Attending: Orthopedic Surgery

## 2012-04-06 DIAGNOSIS — Z9861 Coronary angioplasty status: Secondary | ICD-10-CM

## 2012-04-06 DIAGNOSIS — Z01812 Encounter for preprocedural laboratory examination: Secondary | ICD-10-CM

## 2012-04-06 DIAGNOSIS — M4120 Other idiopathic scoliosis, site unspecified: Principal | ICD-10-CM | POA: Diagnosis present

## 2012-04-06 DIAGNOSIS — E7800 Pure hypercholesterolemia, unspecified: Secondary | ICD-10-CM | POA: Diagnosis present

## 2012-04-06 DIAGNOSIS — M5136 Other intervertebral disc degeneration, lumbar region: Secondary | ICD-10-CM

## 2012-04-06 DIAGNOSIS — Z96659 Presence of unspecified artificial knee joint: Secondary | ICD-10-CM

## 2012-04-06 DIAGNOSIS — M4310 Spondylolisthesis, site unspecified: Secondary | ICD-10-CM | POA: Diagnosis present

## 2012-04-06 DIAGNOSIS — H9190 Unspecified hearing loss, unspecified ear: Secondary | ICD-10-CM | POA: Diagnosis present

## 2012-04-06 DIAGNOSIS — K219 Gastro-esophageal reflux disease without esophagitis: Secondary | ICD-10-CM | POA: Diagnosis present

## 2012-04-06 DIAGNOSIS — M5137 Other intervertebral disc degeneration, lumbosacral region: Secondary | ICD-10-CM | POA: Diagnosis present

## 2012-04-06 DIAGNOSIS — G2581 Restless legs syndrome: Secondary | ICD-10-CM | POA: Diagnosis present

## 2012-04-06 DIAGNOSIS — I2510 Atherosclerotic heart disease of native coronary artery without angina pectoris: Secondary | ICD-10-CM | POA: Diagnosis present

## 2012-04-06 DIAGNOSIS — M51379 Other intervertebral disc degeneration, lumbosacral region without mention of lumbar back pain or lower extremity pain: Secondary | ICD-10-CM | POA: Diagnosis present

## 2012-04-06 DIAGNOSIS — J449 Chronic obstructive pulmonary disease, unspecified: Secondary | ICD-10-CM | POA: Diagnosis present

## 2012-04-06 DIAGNOSIS — Z79899 Other long term (current) drug therapy: Secondary | ICD-10-CM

## 2012-04-06 DIAGNOSIS — J4489 Other specified chronic obstructive pulmonary disease: Secondary | ICD-10-CM | POA: Diagnosis present

## 2012-04-06 DIAGNOSIS — I10 Essential (primary) hypertension: Secondary | ICD-10-CM | POA: Diagnosis present

## 2012-04-06 HISTORY — PX: ANTERIOR LAT LUMBAR FUSION: SHX1168

## 2012-04-06 SURGERY — ANTERIOR LATERAL LUMBAR FUSION 1 LEVEL
Anesthesia: General | Site: Back | Wound class: Clean

## 2012-04-06 MED ORDER — ACETAMINOPHEN 10 MG/ML IV SOLN
1000.0000 mg | Freq: Once | INTRAVENOUS | Status: AC
Start: 2012-04-06 — End: 2012-04-06
  Administered 2012-04-06: 1000 mg via INTRAVENOUS
  Filled 2012-04-06: qty 100

## 2012-04-06 MED ORDER — TORSEMIDE 20 MG PO TABS
20.0000 mg | ORAL_TABLET | Freq: Two times a day (BID) | ORAL | Status: DC
  Administered 2012-04-06 – 2012-04-09 (×6): 20 mg via ORAL
  Filled 2012-04-06 (×7): qty 1

## 2012-04-06 MED ORDER — FENTANYL CITRATE 0.05 MG/ML IJ SOLN
INTRAMUSCULAR | Status: DC | PRN
  Administered 2012-04-06 (×5): 50 ug via INTRAVENOUS
  Administered 2012-04-06 (×2): 100 ug via INTRAVENOUS
  Administered 2012-04-06: 25 ug via INTRAVENOUS
  Administered 2012-04-06: 100 ug via INTRAVENOUS
  Administered 2012-04-06 (×2): 50 ug via INTRAVENOUS
  Administered 2012-04-06: 25 ug via INTRAVENOUS
  Administered 2012-04-06: 50 ug via INTRAVENOUS

## 2012-04-06 MED ORDER — MONTELUKAST SODIUM 10 MG PO TABS
10.0000 mg | ORAL_TABLET | Freq: Every day | ORAL | Status: DC
  Administered 2012-04-06 – 2012-04-08 (×3): 10 mg via ORAL
  Filled 2012-04-06 (×4): qty 1

## 2012-04-06 MED ORDER — OXYCODONE HCL 5 MG PO TABS
10.0000 mg | ORAL_TABLET | ORAL | Status: DC | PRN
  Administered 2012-04-08 – 2012-04-09 (×2): 10 mg via ORAL
  Filled 2012-04-06 (×2): qty 1
  Filled 2012-04-06: qty 2

## 2012-04-06 MED ORDER — SODIUM CHLORIDE 0.9 % IJ SOLN: 3.0000 mL | INTRAMUSCULAR | Status: DC | PRN

## 2012-04-06 MED ORDER — FLEET ENEMA 7-19 GM/118ML RE ENEM: 1.0000 | ENEMA | Freq: Once | RECTAL | Status: AC | PRN

## 2012-04-06 MED ORDER — NALOXONE HCL 0.4 MG/ML IJ SOLN: 0.4000 mg | INTRAMUSCULAR | Status: DC | PRN

## 2012-04-06 MED ORDER — ONDANSETRON HCL 4 MG/2ML IJ SOLN: 4.0000 mg | INTRAMUSCULAR | Status: DC | PRN

## 2012-04-06 MED ORDER — ONDANSETRON HCL 4 MG/2ML IJ SOLN: 4.0000 mg | Freq: Four times a day (QID) | INTRAMUSCULAR | Status: DC | PRN

## 2012-04-06 MED ORDER — LACTATED RINGERS IV SOLN
INTRAVENOUS | Status: DC
  Administered 2012-04-07: 12:00:00 via INTRAVENOUS

## 2012-04-06 MED ORDER — SUCCINYLCHOLINE CHLORIDE 20 MG/ML IJ SOLN
INTRAMUSCULAR | Status: DC | PRN
  Administered 2012-04-06: 80 mg via INTRAVENOUS

## 2012-04-06 MED ORDER — PHENOL 1.4 % MT LIQD: 1.0000 | OROMUCOSAL | Status: DC | PRN

## 2012-04-06 MED ORDER — ATORVASTATIN CALCIUM 10 MG PO TABS
10.0000 mg | ORAL_TABLET | Freq: Every day | ORAL | Status: DC
  Administered 2012-04-06 – 2012-04-09 (×4): 10 mg via ORAL
  Filled 2012-04-06 (×4): qty 1

## 2012-04-06 MED ORDER — HYDRALAZINE HCL 20 MG/ML IJ SOLN
INTRAMUSCULAR | Status: DC | PRN
  Administered 2012-04-06: 5 mg via INTRAVENOUS
  Administered 2012-04-06: 2 mg via INTRAVENOUS

## 2012-04-06 MED ORDER — DIPHENHYDRAMINE HCL 50 MG/ML IJ SOLN: 12.5000 mg | Freq: Four times a day (QID) | INTRAMUSCULAR | Status: DC | PRN

## 2012-04-06 MED ORDER — MIDAZOLAM HCL 5 MG/5ML IJ SOLN
INTRAMUSCULAR | Status: DC | PRN
  Administered 2012-04-06 (×2): 1 mg via INTRAVENOUS

## 2012-04-06 MED ORDER — ACETAMINOPHEN 10 MG/ML IV SOLN
INTRAVENOUS | Status: AC
  Filled 2012-04-06: qty 100

## 2012-04-06 MED ORDER — BUPIVACAINE-EPINEPHRINE PF 0.25-1:200000 % IJ SOLN
INTRAMUSCULAR | Status: AC
  Filled 2012-04-06: qty 30

## 2012-04-06 MED ORDER — OXYCODONE HCL 5 MG PO TABS
5.0000 mg | ORAL_TABLET | Freq: Once | ORAL | Status: AC | PRN
  Administered 2012-04-06: 5 mg via ORAL

## 2012-04-06 MED ORDER — DEXAMETHASONE SODIUM PHOSPHATE 4 MG/ML IJ SOLN
INTRAMUSCULAR | Status: DC | PRN
  Administered 2012-04-06: 10 mg via INTRAVENOUS

## 2012-04-06 MED ORDER — LIDOCAINE HCL (CARDIAC) 20 MG/ML IV SOLN
INTRAVENOUS | Status: DC | PRN
  Administered 2012-04-06: 30 mg via INTRAVENOUS

## 2012-04-06 MED ORDER — HYDROMORPHONE 0.3 MG/ML IV SOLN
INTRAVENOUS | Status: AC
  Filled 2012-04-06: qty 25

## 2012-04-06 MED ORDER — HEMOSTATIC AGENTS (NO CHARGE) OPTIME
TOPICAL | Status: DC | PRN
  Administered 2012-04-06: 1 via TOPICAL

## 2012-04-06 MED ORDER — HYDROMORPHONE 0.3 MG/ML IV SOLN
INTRAVENOUS | Status: DC
  Administered 2012-04-06: 0.3 mg via INTRAVENOUS
  Administered 2012-04-06: 16:00:00 via INTRAVENOUS
  Administered 2012-04-06: 5.39 mg via INTRAVENOUS
  Administered 2012-04-07: 4.9 mg via INTRAVENOUS
  Administered 2012-04-07: 10:00:00 via INTRAVENOUS
  Administered 2012-04-07: 2.59 mg via INTRAVENOUS
  Administered 2012-04-07: 3.8 mg via INTRAVENOUS
  Administered 2012-04-08: 0.3 mg via INTRAVENOUS
  Administered 2012-04-08: 11:00:00 via INTRAVENOUS
  Administered 2012-04-08: 3.39 mg via INTRAVENOUS
  Administered 2012-04-08: 4.19 mg via INTRAVENOUS
  Administered 2012-04-08: 3.79 mg via INTRAVENOUS
  Administered 2012-04-08: 19:00:00 via INTRAVENOUS
  Administered 2012-04-09: 5.99 mg via INTRAVENOUS
  Filled 2012-04-06 (×6): qty 25

## 2012-04-06 MED ORDER — POTASSIUM CHLORIDE CRYS ER 20 MEQ PO TBCR
20.0000 meq | EXTENDED_RELEASE_TABLET | Freq: Two times a day (BID) | ORAL | Status: DC
  Administered 2012-04-06 – 2012-04-09 (×6): 20 meq via ORAL
  Filled 2012-04-06 (×7): qty 1

## 2012-04-06 MED ORDER — FESOTERODINE FUMARATE ER 4 MG PO TB24
4.0000 mg | ORAL_TABLET | Freq: Every day | ORAL | Status: DC
  Administered 2012-04-06 – 2012-04-09 (×4): 4 mg via ORAL
  Filled 2012-04-06 (×4): qty 1

## 2012-04-06 MED ORDER — OXYCODONE HCL 5 MG PO TABS
ORAL_TABLET | ORAL | Status: AC
  Filled 2012-04-06: qty 1

## 2012-04-06 MED ORDER — DIPHENHYDRAMINE HCL 12.5 MG/5ML PO ELIX: 12.5000 mg | ORAL_SOLUTION | Freq: Four times a day (QID) | ORAL | Status: DC | PRN

## 2012-04-06 MED ORDER — SODIUM CHLORIDE 0.9 % IJ SOLN: 9.0000 mL | INTRAMUSCULAR | Status: DC | PRN

## 2012-04-06 MED ORDER — METHOCARBAMOL 100 MG/ML IJ SOLN
500.0000 mg | Freq: Four times a day (QID) | INTRAVENOUS | Status: DC | PRN
  Administered 2012-04-06: 500 mg via INTRAVENOUS
  Filled 2012-04-06: qty 5

## 2012-04-06 MED ORDER — ROPINIROLE HCL 1 MG PO TABS
2.0000 mg | ORAL_TABLET | Freq: Every day | ORAL | Status: DC
  Administered 2012-04-06 – 2012-04-08 (×3): 2 mg via ORAL
  Filled 2012-04-06 (×4): qty 2

## 2012-04-06 MED ORDER — METHOCARBAMOL 500 MG PO TABS
500.0000 mg | ORAL_TABLET | Freq: Four times a day (QID) | ORAL | Status: DC | PRN
  Administered 2012-04-09: 500 mg via ORAL
  Filled 2012-04-06 (×3): qty 1

## 2012-04-06 MED ORDER — PILOCARPINE HCL 5 MG PO TABS
5.0000 mg | ORAL_TABLET | Freq: Three times a day (TID) | ORAL | Status: DC
  Administered 2012-04-06 – 2012-04-09 (×9): 5 mg via ORAL
  Filled 2012-04-06 (×13): qty 1

## 2012-04-06 MED ORDER — HYDROMORPHONE HCL PF 1 MG/ML IJ SOLN
INTRAMUSCULAR | Status: AC
  Filled 2012-04-06: qty 1

## 2012-04-06 MED ORDER — PROMETHAZINE HCL 25 MG/ML IJ SOLN
6.2500 mg | Freq: Once | INTRAMUSCULAR | Status: AC
  Administered 2012-04-06: 6.25 mg via INTRAVENOUS

## 2012-04-06 MED ORDER — LACTATED RINGERS IV SOLN
INTRAVENOUS | Status: DC | PRN
  Administered 2012-04-06 (×3): via INTRAVENOUS

## 2012-04-06 MED ORDER — BUPIVACAINE-EPINEPHRINE 0.25% -1:200000 IJ SOLN
INTRAMUSCULAR | Status: DC | PRN
  Administered 2012-04-06: 10 mL

## 2012-04-06 MED ORDER — WHITE PETROLATUM GEL
Status: AC
  Administered 2012-04-06: 1
  Filled 2012-04-06: qty 5

## 2012-04-06 MED ORDER — THROMBIN 20000 UNITS EX SOLR
CUTANEOUS | Status: AC
  Filled 2012-04-06: qty 20000

## 2012-04-06 MED ORDER — SODIUM CHLORIDE 0.9 % IJ SOLN: 3.0000 mL | Freq: Two times a day (BID) | INTRAMUSCULAR | Status: DC

## 2012-04-06 MED ORDER — MORPHINE SULFATE (PF) 1 MG/ML IV SOLN
INTRAVENOUS | Status: DC
  Administered 2012-04-06: 13:00:00 via INTRAVENOUS
  Administered 2012-04-06: 7 mg via INTRAVENOUS

## 2012-04-06 MED ORDER — PROMETHAZINE HCL 25 MG/ML IJ SOLN
INTRAMUSCULAR | Status: AC
  Filled 2012-04-06: qty 1

## 2012-04-06 MED ORDER — DOCUSATE SODIUM 100 MG PO CAPS
100.0000 mg | ORAL_CAPSULE | Freq: Two times a day (BID) | ORAL | Status: DC
  Administered 2012-04-06 – 2012-04-09 (×6): 100 mg via ORAL
  Filled 2012-04-06 (×7): qty 1

## 2012-04-06 MED ORDER — HYDROMORPHONE HCL PF 1 MG/ML IJ SOLN
0.2500 mg | INTRAMUSCULAR | Status: DC | PRN
  Administered 2012-04-06 (×4): 0.5 mg via INTRAVENOUS

## 2012-04-06 MED ORDER — PROPOFOL 10 MG/ML IV EMUL
INTRAVENOUS | Status: DC | PRN
  Administered 2012-04-06: 100 mg via INTRAVENOUS

## 2012-04-06 MED ORDER — DEXAMETHASONE 4 MG PO TABS
4.0000 mg | ORAL_TABLET | Freq: Four times a day (QID) | ORAL | Status: DC
  Administered 2012-04-06 – 2012-04-09 (×10): 4 mg via ORAL
  Filled 2012-04-06 (×15): qty 1

## 2012-04-06 MED ORDER — ONDANSETRON HCL 4 MG/2ML IJ SOLN
INTRAMUSCULAR | Status: DC | PRN
  Administered 2012-04-06: 4 mg via INTRAVENOUS

## 2012-04-06 MED ORDER — THROMBIN 20000 UNITS EX SOLR
OROMUCOSAL | Status: DC | PRN
  Administered 2012-04-06: 10:00:00 via TOPICAL

## 2012-04-06 MED ORDER — MORPHINE SULFATE (PF) 1 MG/ML IV SOLN
INTRAVENOUS | Status: AC
  Filled 2012-04-06: qty 25

## 2012-04-06 MED ORDER — IPRATROPIUM-ALBUTEROL 18-103 MCG/ACT IN AERO
2.0000 | INHALATION_SPRAY | Freq: Four times a day (QID) | RESPIRATORY_TRACT | Status: DC | PRN
  Filled 2012-04-06: qty 14.7

## 2012-04-06 MED ORDER — SODIUM CHLORIDE 0.9 % IV SOLN: 250.0000 mL | INTRAVENOUS | Status: DC

## 2012-04-06 MED ORDER — EPHEDRINE SULFATE 50 MG/ML IJ SOLN
INTRAMUSCULAR | Status: DC | PRN
  Administered 2012-04-06: 5 mg via INTRAVENOUS

## 2012-04-06 MED ORDER — PANTOPRAZOLE SODIUM 40 MG PO TBEC
40.0000 mg | DELAYED_RELEASE_TABLET | Freq: Every day | ORAL | Status: DC
  Administered 2012-04-06 – 2012-04-09 (×4): 40 mg via ORAL
  Filled 2012-04-06 (×4): qty 1

## 2012-04-06 MED ORDER — MENTHOL 3 MG MT LOZG: 1.0000 | LOZENGE | OROMUCOSAL | Status: DC | PRN

## 2012-04-06 MED ORDER — CEFAZOLIN SODIUM 1-5 GM-% IV SOLN
1.0000 g | Freq: Three times a day (TID) | INTRAVENOUS | Status: AC
  Administered 2012-04-06 – 2012-04-07 (×2): 1 g via INTRAVENOUS
  Filled 2012-04-06 (×2): qty 50

## 2012-04-06 MED ORDER — LABETALOL HCL 5 MG/ML IV SOLN
INTRAVENOUS | Status: DC | PRN
  Administered 2012-04-06: 2 mg via INTRAVENOUS

## 2012-04-06 MED ORDER — DEXAMETHASONE SODIUM PHOSPHATE 4 MG/ML IJ SOLN
4.0000 mg | Freq: Four times a day (QID) | INTRAMUSCULAR | Status: DC
Start: 2012-04-06 — End: 2012-04-09
  Filled 2012-04-06 (×15): qty 1

## 2012-04-06 MED ORDER — OXYCODONE HCL 5 MG/5ML PO SOLN: 5.0000 mg | Freq: Once | ORAL | Status: AC | PRN

## 2012-04-06 SURGICAL SUPPLY — 70 items
APPLIER CLIP 11 MED OPEN (CLIP)
BLADE SURG 10 STRL SS (BLADE) ×2 IMPLANT
BLADE SURG ROTATE 9660 (MISCELLANEOUS) IMPLANT
CAGE COUGAR LATERAL 18X10X55 (Cage) ×2 IMPLANT
CLIP APPLIE 11 MED OPEN (CLIP) IMPLANT
CLOTH BEACON ORANGE TIMEOUT ST (SAFETY) ×2 IMPLANT
CORDS BIPOLAR (ELECTRODE) ×2 IMPLANT
COVER SURGICAL LIGHT HANDLE (MISCELLANEOUS) ×2 IMPLANT
DERMABOND ADVANCED (GAUZE/BANDAGES/DRESSINGS) ×1
DERMABOND ADVANCED .7 DNX12 (GAUZE/BANDAGES/DRESSINGS) ×1 IMPLANT
DRAPE C-ARM 42X72 X-RAY (DRAPES) ×2 IMPLANT
DRAPE ORTHO SPLIT 77X108 STRL (DRAPES) ×1
DRAPE POUCH INSTRU U-SHP 10X18 (DRAPES) ×2 IMPLANT
DRAPE SURG ORHT 6 SPLT 77X108 (DRAPES) ×1 IMPLANT
DRAPE U-SHAPE 47X51 STRL (DRAPES) ×4 IMPLANT
DRSG MEPILEX BORDER 4X12 (GAUZE/BANDAGES/DRESSINGS) ×2 IMPLANT
DRSG MEPILEX BORDER 4X8 (GAUZE/BANDAGES/DRESSINGS) IMPLANT
DURAPREP 26ML APPLICATOR (WOUND CARE) ×2 IMPLANT
EIGR WAVEGUIDE, NARROW/FLAT ×4 IMPLANT
ELECT BLADE 4.0 EZ CLEAN MEGAD (MISCELLANEOUS) ×2
ELECT CAUTERY BLADE 6.4 (BLADE) ×2 IMPLANT
ELECT REM PT RETURN 9FT ADLT (ELECTROSURGICAL) ×2
ELECTRODE BLDE 4.0 EZ CLN MEGD (MISCELLANEOUS) ×1 IMPLANT
ELECTRODE REM PT RTRN 9FT ADLT (ELECTROSURGICAL) ×1 IMPLANT
GAUZE SPONGE 4X4 16PLY XRAY LF (GAUZE/BANDAGES/DRESSINGS) IMPLANT
GLOVE BIOGEL PI IND STRL 6.5 (GLOVE) ×1 IMPLANT
GLOVE BIOGEL PI IND STRL 8.5 (GLOVE) ×1 IMPLANT
GLOVE BIOGEL PI INDICATOR 6.5 (GLOVE) ×1
GLOVE BIOGEL PI INDICATOR 8.5 (GLOVE) ×1
GLOVE ECLIPSE 6.0 STRL STRAW (GLOVE) ×2 IMPLANT
GLOVE ECLIPSE 8.5 STRL (GLOVE) ×2 IMPLANT
GOWN PREVENTION PLUS XXLARGE (GOWN DISPOSABLE) ×2 IMPLANT
GOWN STRL NON-REIN LRG LVL3 (GOWN DISPOSABLE) ×4 IMPLANT
GUIDEWIRE 1.6X480MM (WIRE) ×4 IMPLANT
KIT BASIN OR (CUSTOM PROCEDURE TRAY) ×2 IMPLANT
KIT ORACLE NEUROMONITING (KITS) ×2 IMPLANT
KIT ROOM TURNOVER OR (KITS) ×2 IMPLANT
MIX DBX 10CC 35% BONE (Bone Implant) ×2 IMPLANT
NEEDLE 22X1 1/2 (OR ONLY) (NEEDLE) ×2 IMPLANT
NEEDLE I-PASS III (NEEDLE) ×2 IMPLANT
NEEDLE SPNL 18GX3.5 QUINCKE PK (NEEDLE) ×2 IMPLANT
NS IRRIG 1000ML POUR BTL (IV SOLUTION) ×2 IMPLANT
PACK LAMINECTOMY ORTHO (CUSTOM PROCEDURE TRAY) ×2 IMPLANT
PACK UNIVERSAL I (CUSTOM PROCEDURE TRAY) ×2 IMPLANT
PAD ARMBOARD 7.5X6 YLW CONV (MISCELLANEOUS) ×4 IMPLANT
ROD MATRIX MIS 40MM (Rod) ×2 IMPLANT
SCREW MATRIX MIS 6.0X40MM (Screw) ×4 IMPLANT
SPONGE INTESTINAL PEANUT (DISPOSABLE) IMPLANT
SPONGE LAP 4X18 X RAY DECT (DISPOSABLE) ×4 IMPLANT
SPONGE SURGIFOAM ABS GEL 100 (HEMOSTASIS) ×2 IMPLANT
STRIP CLOSURE SKIN 1/2X4 (GAUZE/BANDAGES/DRESSINGS) IMPLANT
SURGIFLO TRUKIT (HEMOSTASIS) ×2 IMPLANT
SUT MNCRL AB 3-0 PS2 18 (SUTURE) ×4 IMPLANT
SUT PROLENE 5 0 C 1 24 (SUTURE) IMPLANT
SUT SILK 2 0 TIES 10X30 (SUTURE) IMPLANT
SUT SILK 3 0 TIES 10X30 (SUTURE) IMPLANT
SUT VIC AB 1 CT1 27 (SUTURE)
SUT VIC AB 1 CT1 27XBRD ANBCTR (SUTURE) IMPLANT
SUT VIC AB 1 CTX 36 (SUTURE)
SUT VIC AB 1 CTX36XBRD ANBCTR (SUTURE) IMPLANT
SUT VIC AB 2-0 CT1 18 (SUTURE) ×4 IMPLANT
SYR BULB IRRIGATION 50ML (SYRINGE) ×2 IMPLANT
SYR CONTROL 10ML LL (SYRINGE) ×2 IMPLANT
TAP CANN 5MM (TAP) ×2 IMPLANT
TAPE CLOTH 4X10 WHT NS (GAUZE/BANDAGES/DRESSINGS) ×4 IMPLANT
TOWEL OR 17X24 6PK STRL BLUE (TOWEL DISPOSABLE) ×2 IMPLANT
TOWEL OR 17X26 10 PK STRL BLUE (TOWEL DISPOSABLE) ×2 IMPLANT
TRAY FOLEY CATH 14FRSI W/METER (CATHETERS) ×2 IMPLANT
WATER STERILE IRR 1000ML POUR (IV SOLUTION) ×2 IMPLANT
matrix locking cap ×4 IMPLANT

## 2012-04-06 NOTE — Anesthesia Preprocedure Evaluation (Addendum)
Anesthesia Evaluation  Patient identified by MRN, date of birth, ID band Patient awake    Reviewed: Allergy & Precautions, H&P , NPO status , Patient's Chart, lab work & pertinent test results  History of Anesthesia Complications (+) PROLONGED EMERGENCE  Airway Mallampati: II TM Distance: >3 FB Neck ROM: Full    Dental No notable dental hx. (+) Teeth Intact and Dental Advisory Given   Pulmonary shortness of breath and with exertion, asthma , COPD breath sounds clear to auscultation  Pulmonary exam normal       Cardiovascular hypertension, On Medications + CAD Rhythm:Regular Rate:Normal     Neuro/Psych negative neurological ROS  negative psych ROS   GI/Hepatic Neg liver ROS, GERD-  Medicated and Controlled,  Endo/Other  negative endocrine ROS  Renal/GU negative Renal ROS  negative genitourinary   Musculoskeletal  (+) Arthritis -,   Abdominal   Peds  Hematology negative hematology ROS (+) REFUSES BLOOD PRODUCTS, JEHOVAH'S WITNESS  Anesthesia Other Findings   Reproductive/Obstetrics negative OB ROS                          Anesthesia Physical Anesthesia Plan  ASA: III  Anesthesia Plan: General   Post-op Pain Management:    Induction: Intravenous  Airway Management Planned: Oral ETT  Additional Equipment:   Intra-op Plan:   Post-operative Plan: Extubation in OR  Informed Consent: I have reviewed the patients History and Physical, chart, labs and discussed the procedure including the risks, benefits and alternatives for the proposed anesthesia with the patient or authorized representative who has indicated his/her understanding and acceptance.   Dental advisory given  Plan Discussed with: CRNA  Anesthesia Plan Comments:         Anesthesia Quick Evaluation

## 2012-04-06 NOTE — H&P (Signed)
No change in clinical exam H+P reviewed  

## 2012-04-06 NOTE — Anesthesia Postprocedure Evaluation (Signed)
  Anesthesia Post-op Note  Patient: [name]  Procedure(s) Performed: Procedure(s) (LRB): ANTERIOR LATERAL LUMBAR FUSION 1 LEVEL (N/A)  Patient Location: PACU  Anesthesia Type: General  Level of Consciousness: awake  Airway and Oxygen Therapy: Patient Spontanous Breathing and Patient connected to nasal cannula oxygen  Post-op Pain: moderate  Post-op Assessment: Post-op Vital signs reviewed, Patient's Cardiovascular Status Stable, Respiratory Function Stable, Patent Airway and NAUSEA AND VOMITING PRESENT  Post-op Vital Signs: Reviewed and stable  Complications: No apparent anesthesia complications

## 2012-04-06 NOTE — Progress Notes (Signed)
UR COMPLETED  

## 2012-04-06 NOTE — Brief Op Note (Signed)
04/06/2012  11:26 AM  PATIENT:  [name]  78 y.o. female  PRE-OPERATIVE DIAGNOSIS:  grade III degenerative slip with stenosis L3-L4   POST-OPERATIVE DIAGNOSIS:  grade III degenerative slip with stenosis L3-4  PROCEDURE:  Procedure(s) (LRB): ANTERIOR LATERAL LUMBAR FUSION 1 LEVEL (N/A)  SURGEON:  Surgeon(s) and Role:    * Venita Lick, MD - Primary  PHYSICIAN ASSISTANT:   ASSISTANTS: Norval Gable   ANESTHESIA:   general  EBL:  Total I/O In: 2000 [I.V.:2000] Out: 125 [Urine:125]  BLOOD ADMINISTERED:none  DRAINS: none   LOCAL MEDICATIONS USED:  MARCAINE     SPECIMEN:  No Specimen  DISPOSITION OF SPECIMEN:  N/A  COUNTS:  YES  TOURNIQUET:  * No tourniquets in log *  DICTATION: .Other Dictation: Dictation Number [id number]  PLAN OF CARE: Admit to inpatient   PATIENT DISPOSITION:  PACU - hemodynamically stable.

## 2012-04-06 NOTE — Transfer of Care (Signed)
Immediate Anesthesia Transfer of Care Note  Patient: [name]  Procedure(s) Performed: Procedure(s) (LRB): ANTERIOR LATERAL LUMBAR FUSION 1 LEVEL (N/A)  Patient Location: PACU  Anesthesia Type: General  Level of Consciousness: awake, oriented, patient cooperative and responds to stimulation  Airway & Oxygen Therapy: Patient Spontanous Breathing and Patient connected to nasal cannula oxygen  Post-op Assessment: Report given to PACU RN, Post -op Vital signs reviewed and stable and Patient moving all extremities X 4  Post vital signs: Reviewed and stable  Complications: No apparent anesthesia complications

## 2012-04-06 NOTE — Preoperative (Signed)
Beta Blockers   Reason not to administer Beta Blockers:Not Applicable 

## 2012-04-07 ENCOUNTER — Encounter (HOSPITAL_COMMUNITY): Payer: Self-pay | Admitting: Orthopedic Surgery

## 2012-04-07 NOTE — Evaluation (Signed)
Physical Therapy Evaluation Patient Details Name: ALICEN DONALSON MRN: [id number] DOB: 07/25/1930 Today's Date: 04/07/2012 Time: 3244-0102 PT Time Calculation (min): 29 min  PT Assessment / Plan / Recommendation Clinical Impression  Pt is 78 y.o. female s/p lumbar fusion. Pt is pleasent and cooperative this am despite pain intensity and cramping of LLE. Patient demonstrates deficits in functional mobility at this time related to pain, weakness, and decreased activity tolerance. Pt also presents with deficits ? if related to prior comorbidities (prior L TKA).  Will continue to work with pt to improve gait, functional mobility and adherence to spinal precautions.    PT Assessment  Patient needs continued PT services    Follow Up Recommendations  Skilled nursing facility    Barriers to Discharge Decreased caregiver support;Inaccessible home environment Stairs to enter the home and no caregiver support as patient lives alone    Equipment Recommendations  None recommended by PT    Recommendations for Other Services OT consult   Frequency Min 5X/week    Precautions / Restrictions Precautions Precautions: Back Precaution Booklet Issued: Yes (comment) Required Braces or Orthoses: Spinal Brace Spinal Brace: Applied in sitting position   Pertinent Vitals/Pain 5/10      Mobility  Bed Mobility Bed Mobility: Rolling Left;Rolling Right;Sit to Sidelying Left Rolling Right: 4: Min assist;With rail Rolling Left: 4: Min assist;With rail Sit to Sidelying Left: 3: Mod assist;HOB flat Details for Bed Mobility Assistance: Pt required assist for lower extremity bilaterally Transfers Transfers: Sit to Stand;Stand to Sit Sit to Stand: From chair/3-in-1;With armrests;4: Min guard Stand to Sit: 4: Min guard;To bed Ambulation/Gait Ambulation/Gait Assistance: 4: Min guard Ambulation Distance (Feet): 40 Feet Assistive device: Rolling walker Ambulation/Gait Assistance Details: Min guard for  instability secondary to LLE weakness Gait Pattern: Step-through pattern;Decreased stride length;Left flexed knee in stance;Antalgic;Lateral hip instability;Trunk flexed Gait velocity: decreased General Gait Details: Pt presents with increased knee flexion in LLE during ambulation which causes a near buckling of the left knee during ambulation    Exercises     PT Diagnosis: Difficulty walking;Abnormality of gait;Generalized weakness;Acute pain  PT Problem List: Decreased strength;Decreased mobility;Decreased activity tolerance;Pain;Decreased knowledge of precautions PT Treatment Interventions: DME instruction;Gait training;Stair training;Functional mobility training;Therapeutic activities;Therapeutic exercise;Patient/family education   PT Goals Acute Rehab PT Goals PT Goal Formulation: With patient Time For Goal Achievement: 04/12/12 Potential to Achieve Goals: Good Pt will Roll Supine to Right Side: with modified independence PT Goal: Rolling Supine to Right Side - Progress: Goal set today Pt will Roll Supine to Left Side: with modified independence PT Goal: Rolling Supine to Left Side - Progress: Goal set today Pt will Ambulate: >150 feet;with modified independence;with rolling walker PT Goal: Ambulate - Progress: Goal set today Pt will Go Up / Down Stairs: 3-5 stairs;with modified independence;with least restrictive assistive device PT Goal: Up/Down Stairs - Progress: Goal set today  Visit Information  Last PT Received On: 04/07/12 Assistance Needed: +1    Subjective Data  Subjective: I'm hurting right now because i have been up for a while Patient Stated Goal: to be able to get around ok before i go home   Prior Functioning  Home Living Lives With: Alone Available Help at Discharge: Friend(s) Type of Home: House Home Access: Stairs to enter Secretary/administrator of Steps: 3 Entrance Stairs-Rails: Left Home Layout: One level Bathroom Shower/Tub: Walk-in shower;Other  (comment) (with threshold) Bathroom Toilet: Standard Home Adaptive Equipment: Walker - rolling;Straight cane;Shower chair with back Prior Function Level of Independence: Independent (had some  falls) Able to Take Stairs?: Yes Driving: Yes Vocation: Retired    IT consultant  Overall Cognitive Status: Appears within functional limits for tasks assessed/performed Arousal/Alertness: Awake/alert Orientation Level: Oriented X4 / Intact;Appears intact for tasks assessed Behavior During Session: [facility] for tasks performed    Extremity/Trunk Assessment Right Upper Extremity Assessment RUE ROM/Strength/Tone: [facility] for tasks assessed Left Upper Extremity Assessment LUE ROM/Strength/Tone: [facility] for tasks assessed Right Lower Extremity Assessment RLE ROM/Strength/Tone: [facility] for tasks assessed Left Lower Extremity Assessment LLE ROM/Strength/Tone: Deficits;Unable to fully assess LLE ROM/Strength/Tone Deficits: Cramping in left leg LLE Sensation: Deficits (numness and cramping)   Balance    End of Session PT - End of Session Equipment Utilized During Treatment: Gait belt;Back brace Activity Tolerance: Patient tolerated treatment well;Patient limited by pain;Patient limited by fatigue Patient left: in bed;with call bell/phone within reach Nurse Communication: Mobility status  GP     Fabio Asa 04/07/2012, 10:18 AM Charlotte Crumb, PT DPT  [phone]

## 2012-04-07 NOTE — Progress Notes (Signed)
    Subjective: Procedure(s) (LRB): ANTERIOR LATERAL LUMBAR FUSION 1 LEVEL (N/A) 1 Day Post-Op  Patient reports pain as 6 on 0-10 scale.  Reports mostly low back pain with buttock and thigh pain on the left reports incisional back pain   Negative void Negative bowel movement Negative flatus Negative chest pain or shortness of breath  Objective: Vital signs in last 24 hours: Temp:  [96.8 F (36 C)-98.8 F (37.1 C)] 98.8 F (37.1 C) (08/29 0639) Pulse Rate:  [59-76] 65  (08/29 0639) Resp:  [8-22] 18  (08/29 0639) BP: (152-179)/(57-89) 166/60 mmHg (08/29 0639) SpO2:  [98 %-100 %] 98 % (08/29 0639) Weight:  [68.947 kg (152 lb)] 68.947 kg (152 lb) (08/28 1600)  Intake/Output from previous day: 08/28 0701 - 08/29 0700 In: 2500 [I.V.:2500] Out: 2475 [Urine:2375; Blood:100]  No results found for this basename: WBC:2,RBC:2,HCT:2,PLT:2 in the last 72 hours No results found for this basename: NA:2,K:2,CL:2,CO2:2,BUN:2,CREATININE:2,GLUCOSE:2,CALCIUM:2 in the last 72 hours No results found for this basename: LABPT:2,INR:2 in the last 72 hours  Neurologically intact Neurovascular intact Dorsiflexion/Plantar flexion intact Incision: dressing C/D/I Compartment soft  Assessment/Plan: Patient stable  xrays per Dr. Shon Baton Mobilization with physical therapy today Continue care/pain management   [name] 04/07/2012, 8:02 AM

## 2012-04-07 NOTE — Op Note (Signed)
[name], [name] NO.:  [id number]  MEDICAL RECORD NO.:  [id number]  LOCATION:  [id number]                        FACILITY:  [facility]  PHYSICIAN:  Alvy Beal, MD    DATE OF BIRTH:  November 25, 1929  DATE OF PROCEDURE:  04/06/2012 DATE OF DISCHARGE:                              OPERATIVE REPORT   PREOPERATIVE DIAGNOSES:  Degenerative spinal stenosis with degenerative scoliosis, anemia.  POSTOPERATIVE DIAGNOSES:  Degenerative spinal stenosis with degenerative scoliosis, anemia.  OPERATIVE PROCEDURE: 1. Lateral interbody fusion with carbon fiber (biomechanical)     intervertebral cage. 2.  Posterolateral arthrodesis, L3-4 with DBX     mix. 2. Posterolateral instrumented fusion with pedicle screw fixation, L3-     4.  COMPLICATIONS:  None.  FIRST ASSISTANT:  Norval Gable, PA  HISTORY:  This is a very pleasant 78 year old woman, who has been having severe progressive debilitating back, buttock, and bilateral leg pain, right side worse than the left.  Despite attempts at conservative management and physical therapy, her overall condition continued to deteriorate.  The patient was noted to have significant anterior listhesis (spondylolisthesis as well as scoliosis and severe spinal stenosis at L3-4).  Given the instability pattern and the potential risk for further instability and the failure of conservative management, we elected to proceed with surgery.  All appropriate risks, benefits, and alternatives were discussed with the patient and consent was obtained.  OPERATIVE NOTE:  The patient was brought to the operating room, placed supine on the operating table.  After successful induction of general anesthesia and endotracheal intubation, TEDs, SCDs, and a Foley were inserted.  All appropriate neuromonitoring needles were placed by the Neuro[employer]im representative.  The patient was then turned into the lateral decubitus position with the left side up.  Axillary  roll was placed. The arms were well padded and she was secured to the table with tape. Once the patient was properly secured down, fluoroscopic views in the AP and lateral planes centered at the L3-4 space confirmed satisfactory positioning.  Once I confirmed satisfactory position in both planes, I then proceeded with the surgical procedure.  The lateral side of the spine and posterior aspect of the spine were prepped and draped in a standard fashion.  A time-out was then done to confirm patient, procedure, and all other pertinent important data.  Once that was completed, I then identified the anterior aspect of the L3 and posterior aspect of L3 vertebral body.  I then marked this out and then infiltrated the skin incision with 0.25% Marcaine.  I then infiltrated where the posterior pedicle screw incision would also be.  Once this was done, I made a lateral incision centered over the L3-4 disk space.  I sharply dissected through the adipose tissue down to the fascia of the external oblique.  I then made a second incision about the size of my finger, 1 fingerbreadth posterior to the lateral incision.  I then bluntly dissected down to the posterior fascia of the retroperitoneal space using a Eric Form, broke through the posterior fascia.  I then used my finger to finger dissect the retroperitoneal space.  I was able to mobilize the material.  I then palpated the lateral aspect of the psoas muscle as well as the ventral surface of the transverse process.  I then swept superiorly until I was on the under surface of the external obliques.  I then bluntly dissected through the oblique until my finger emerged.  Once this was done, I then placed the initial trocar on top of my finger and then advanced it directly down on top of the psoas muscle.  I then stimulated circumferentially to ensure that until I identified roughly the location of the lumbar plexus which was somewhat posterior.   Once this was done, I made sure I was centered over the L3-4 disk space and then I made my transpsoas approach to the disk. I confirmed in both the AP and lateral planes the trajectory and the position.  Once this was done, I then secured the initial trocar through the disk space with a probe.  Once this was done, I then sequentially dilated up with the dilating portals.  At all times, I ensured that the lumbar plexus was posterior to my dilating blades.  Once I had the final dilator, I then obtained a 120 mm long blade and then put my working portal down.  Once the working portal was down, I secured it to the table and then removed the dilating tubes.  I then stimulated behind the working retractor to ensure that the lumbar plexus was indeed posterior in my field.  Once I confirmed this, I then secured the retractor to the disk space.  Once this was done, I could now be adequately visualized the entire L3-4 disk space.  Once confirming before incising the disk that I was at the L3-4 level, I then proceeded with the surgery.  A #15 blade scalpel was then used to perform an annulotomy and then using a combination of pituitary rongeurs, curettes, and Kerrison rongeurs, I removed the disk material.  I then released the osteophyte and anulus from the contralateral side.  Care was taken not to violate the anterior or posterior longitudinal ligaments.  Once I had the channel diskectomy created, I was able to visualize the endplates. There was no significant compromise to the subchondral bone.  The cartilaginous endplates, however, was completely excised.  I then wrapped still I had bleeding subchondral bone.  I then used sequential trials starting at an 8 trial and moving to the 10 parallel.  I actually had excellent fixation with the 10 parallel trial and so this is the implant I elected to use.  I then obtained the implant, packed it with DBX mix and then malleted it to the appropriate depth.   I got excellent interbody position.  Although, there was not much reduction of the spondylolisthesis, there was excellent correction of the scoliosis and there was excellent restoration of disk height and indirect foraminal decompression.  Once I had completed this, I irrigated the wound copiously with normal saline, obtained hemostasis using bipolar electrocautery and FloSeal.  I then gently removed the retractor, checked and made sure there was no significant bleeding.  I then irrigated the wound, closed the fascia with the external oblique with running #1 Vicryl suture, and then a superficial 2-0 Vicryl suture, 3-0 Monocryl for the skin.  The stab incision I created for finger dissection was irrigated and closed with a layer of 2-0 interrupted and 3-0 Monocryl.  At this point, x-rays were taken which demonstrated satisfactory position of the graft and no evidence of any hardware complication.  Once this  was completed, I then identified with the patient still in the lateral position, the lateral border of the L3-L4 pedicle.  A small incision was made on the posterior lateral aspect of the spine and a Jamshidi needle was advanced down through this incision to the lateral aspect of the L3 pedicle.  I confirmed the position in the AP and lateral planes and then advanced the Jamshidi needle through the pedicle and into the L3 vertebral body.  I then advanced the guide pin over this and repeated the procedure at the left L4 pedicle.  Once both pedicles were properly cannulated, I then tapped and then placed 40 mm Synthes MIS pedicle screws.  I then stimulated both pedicle screws with the probe and neither one demonstrated electrodiagnostic evidence of pedicle breach.  Radiographically they were also satisfactory in their position.  At this point in time with the pedicle screws in position, I then decorticated the facet complex and placed the remaining portion of bone graft in the  posterolateral aspect of the spine.  I then measured and contoured and placed the MIS rod and secured it down with the locking nut.  Both nuts were torqued appropriately.  I then removed the retracting devices and then irrigated the wound and closed it with #1 Vicryl suture, 2-0 Vicryl suture, and then a 3-0 Monocryl.  Steri-Strips and dry dressing were applied.  At the end of the case, all needle and sponge counts were correct.  The patient tolerated the procedure well. There were no adverse intraoperative events.     Alvy Beal, MD     DDB/MEDQ  D:  04/06/2012  T:  04/07/2012  Job:  [id number]

## 2012-04-07 NOTE — Progress Notes (Signed)
Occupational Therapy Evaluation Patient Details Name: [name] MRN: [id number] DOB: Jan 04, 1930 Today's Date: 04/07/2012 Time: 4098-1191 OT Time Calculation (min): 33 min  OT Assessment / Plan / Recommendation Clinical Impression  Pleasant 78 yo s/p L3-4 fusion. PTA, pt lived alone, independently. Pt plans to D/C to SNF for rehab, but will benefit from skilled OT services to max independence and safety with ADL and functional mobility for ADL to increase adherance to back precautions and decrease time in facility. Pt very motivated to return to PLOF.     OT Assessment  Patient needs continued OT Services    Follow Up Recommendations  Skilled nursing facility    Barriers to Discharge Decreased caregiver support    Equipment Recommendations  Defer to next venue    Recommendations for Other Services    Frequency  Min 2X/week    Precautions / Restrictions Precautions Precautions: Back Precaution Booklet Issued: Yes (comment) Precaution Comments: Able to recall 1/3 precautions Required Braces or Orthoses: Spinal Brace Spinal Brace: Applied in sitting position   Pertinent Vitals/Pain No c/o    ADL  Grooming: Simulated;Supervision/safety Where Assessed - Grooming: Unsupported sitting Upper Body Bathing: Simulated;Minimal assistance Where Assessed - Upper Body Bathing: Unsupported sitting Lower Body Bathing: Simulated;Maximal assistance Where Assessed - Lower Body Bathing: Supported sit to stand Upper Body Dressing: Simulated;Minimal assistance Where Assessed - Upper Body Dressing: Unsupported sitting Lower Body Dressing: Simulated;Maximal assistance Where Assessed - Lower Body Dressing: Supported sit to stand Toilet Transfer: Simulated;Minimal assistance Toilet Transfer Method: Sit to stand;Stand pivot Acupuncturist: Other (comment) (bed - chair) Toileting - Clothing Manipulation and Hygiene: Simulated;Moderate assistance Where Assessed - Toileting Clothing  Manipulation and Hygiene: Standing Equipment Used: Back brace;Gait belt;Reacher;Sock aid Transfers/Ambulation Related to ADLs: Min A ADL Comments: No knowledge of AE and use of AE and compensatory techniques for ADL.    OT Diagnosis: Generalized weakness;Acute pain  OT Problem List: Decreased strength;Decreased range of motion;Decreased activity tolerance;Decreased safety awareness;Decreased knowledge of use of DME or AE;Decreased knowledge of precautions;Pain OT Treatment Interventions: Self-care/ADL training;Energy conservation;Therapeutic activities;Patient/family education   OT Goals Acute Rehab OT Goals OT Goal Formulation: With patient Time For Goal Achievement: 04/14/12 Potential to Achieve Goals: Good ADL Goals Pt Will Perform Toileting - Clothing Manipulation: with supervision;with cueing (comment type and amount);Standing ADL Goal: Toileting - Clothing Manipulation - Progress: Goal set today Pt Will Perform Toileting - Hygiene: with supervision;with caregiver independent in assisting;Sit to stand from 3-in-1/toilet;Standing at 3-in-1/toilet;with cueing (comment type and amount) ADL Goal: Toileting - Hygiene - Progress: Goal set today Pt Will Perform Tub/Shower Transfer: Other (comment) (discontinue) ADL Goal: Tub/Shower Transfer - Progress: Discontinued (comment) Additional ADL Goal #1: Pt will state 3/3 back precautions independently. ADL Goal: Additional Goal #1 - Progress: Goal set today  Visit Information  Last OT Received On: 04/07/12 Assistance Needed: +1    Subjective Data      Prior Functioning  Vision/Perception  Home Living Lives With: Alone Available Help at Discharge: Friend(s) Type of Home: House Home Access: Stairs to enter [employer] of Steps: 3 Entrance Stairs-Rails: Left Home Layout: One level Bathroom Shower/Tub: Walk-in shower;Other (comment) (with threshold) Bathroom Toilet: Standard Home Adaptive Equipment: Walker -  rolling;Straight cane;Shower chair with back Prior Function Level of Independence: Independent (had some falls) Able to Take Stairs?: Yes Driving: Yes Vocation: Retired Musician: No difficulties Dominant Hand: Right      Cognition  Overall Cognitive Status: Appears within functional limits for tasks assessed/performed Arousal/Alertness: Awake/alert Orientation  Level: Oriented X4 / Intact;Appears intact for tasks assessed Behavior During Session: [facility] for tasks performed    Extremity/Trunk Assessment Right Upper Extremity Assessment RUE ROM/Strength/Tone: [facility] for tasks assessed Left Upper Extremity Assessment LUE ROM/Strength/Tone: [facility] for tasks assessed Trunk Assessment Trunk Assessment: Normal;Other exceptions (fusion)   Mobility  Shoulder Instructions  Bed Mobility Bed Mobility: Rolling Left;Left Sidelying to Sit;Sitting - Scoot to Edge of Bed Rolling Left: 5: Supervision;With rail Left Sidelying to Sit: 4: Min assist;HOB flat;With rails Sitting - Scoot to Edge of Bed: 5: Supervision Transfers Transfers: Sit to Stand;Stand to Sit Sit to Stand: 4: Min assist;From bed;With upper extremity assist Stand to Sit: 4: Min guard;With upper extremity assist;To chair/3-in-1 Details for Transfer Assistance: vc to follow back precautions       Exercise     Balance  WFL for ADL   End of Session OT - End of Session Equipment Utilized During Treatment: Gait belt;Back brace Activity Tolerance: Patient tolerated treatment well Patient left: in chair;with call bell/phone within reach Nurse Communication: Mobility status  GO     [name],HILLARY 04/07/2012, 4:11 PM [facility], OTR/L  [phone] 04/07/2012

## 2012-04-08 ENCOUNTER — Inpatient Hospital Stay (HOSPITAL_COMMUNITY): Payer: Medicare Other

## 2012-04-08 MED ORDER — OXYCODONE-ACETAMINOPHEN 10-325 MG PO TABS: 1.0000 | ORAL_TABLET | Freq: Four times a day (QID) | ORAL | Status: AC | PRN

## 2012-04-08 MED ORDER — METHOCARBAMOL 500 MG PO TABS: 500.0000 mg | ORAL_TABLET | Freq: Three times a day (TID) | ORAL | Status: AC

## 2012-04-08 MED ORDER — ONDANSETRON HCL 4 MG PO TABS: 4.0000 mg | ORAL_TABLET | Freq: Three times a day (TID) | ORAL | Status: AC | PRN

## 2012-04-08 MED ORDER — POLYETHYLENE GLYCOL 3350 17 G PO PACK: 17.0000 g | PACK | Freq: Every day | ORAL | Status: AC

## 2012-04-08 NOTE — Discharge Summary (Signed)
Patient ID: [name] MRN: [id number] DOB/AGE: 03/06/1930 78 y.o.  Admit date: 04/06/2012 Discharge date: 04/09/12   Admission Diagnoses:  grade 3 degenerative slip with stenosis L3-4  Asthma  Hypertension  Heart disease  Diverticulitis Of Colon    Discharge Diagnoses:  XLIF L3-4 with poterior instrumentation for a grade 3 degenerative slip with stenosis L3-4 status post Procedure(s): ANTERIOR LATERAL LUMBAR FUSION 1 LEVEL Asthma  Hypertension  Heart disease  Diverticulitis Of Colon   Past Medical History  Diagnosis Date  . HOH (hard of hearing)     Wears Bilateral hearing aids  . Complication of anesthesia     Blood pressure dropped after hammertoe surgery; difficulty waking up  . Hypertension   . Pneumonia     hx of  . Bronchitis     hx of  . COPD (chronic obstructive pulmonary disease)   . Shortness of breath     With ambulation at times  . Asthma   . Vertigo     hx of  . Syncope 2009    Has not had another episode since 2009  . Urination frequency     excessive at bedtime  . GERD (gastroesophageal reflux disease)   . Arthritis   . Anemia     hx of. None since hysterectomy  . Hypercholesteremia   . Restless leg syndrome   . Coronary artery disease     prox LAD stent '06    Surgeries: Procedure(s): ANTERIOR LATERAL LUMBAR FUSION 1 LEVEL on 04/06/2012   Consultants: none  Discharged Condition: Improved  Hospital Course: [name] is an 78 y.o. female who was admitted 04/06/2012 for operative treatment of grade 3 degenerative spondylolisthesis L3-4 with low back pain and radiculopathy. Patient failed conservative treatments (please see the history and physical for the specifics) and had severe unremitting pain that affects sleep, daily activities and work/hobbies. After pre-op clearance, the patient was taken to the operating room on 04/06/2012 and underwent  Procedure(s): ANTERIOR LATERAL LUMBAR FUSION 1 LEVEL.    Patient was given  perioperative antibiotics: Anti-infectives     Start     Dose/Rate Route Frequency Ordered Stop   04/06/12 1645   ceFAZolin (ANCEF) IVPB 1 g/50 mL premix        1 g 100 mL/hr over 30 Minutes Intravenous Every 8 hours 04/06/12 1637 04/07/12 0115   04/05/12 1543   ceFAZolin (ANCEF) IVPB 2 g/50 mL premix        2 g 100 mL/hr over 30 Minutes Intravenous 60 min pre-op 04/05/12 1543 04/06/12 0905           Patient was given sequential compression devices and early ambulation to prevent DVT.   Patient benefited maximally from hospital stay and there were no complications. At the time of discharge, the patient was urinating/moving their bowels without difficulty, tolerating a regular diet, pain is controlled with oral pain medications and they have been cleared by PT/OT.   Recent vital signs: Patient Vitals for the past 24 hrs:  BP Temp Pulse Resp SpO2  04/08/12 1356 - - - 18  95 %  04/08/12 1039 - - - 18  97 %  04/08/12 0828 - - - 22  99 %  04/08/12 0512 125/46 mmHg 98.2 F (36.8 C) 59  16  95 %  04/08/12 0203 134/61 mmHg 98.2 F (36.8 C) 67  18  95 %  04/07/12 2203 128/61 mmHg 97.5 F (36.4 C) 70  18  97 %  04/07/12 1721 - - - 18  95 %     Recent laboratory studies: No results found for this basename: WBC:2,HGB:2,HCT:2,PLT:2,NA:2,K:2,CL:2,CO2:2,BUN:2,CREATININE:2,GLUCOSE:2,PT:2,INR:2,CALCIUM,2: in the last 72 hours   Discharge Medications:   Medication List  As of 04/08/2012  2:32 PM   STOP taking these medications         Oxycodone HCl 10 MG Tabs         TAKE these medications         albuterol-ipratropium 18-103 MCG/ACT inhaler   Commonly known as: COMBIVENT   Inhale 2 puffs into the lungs every 6 (six) hours as needed. For shortness of breath      atorvastatin 10 MG tablet   Commonly known as: LIPITOR   Take 10 mg by mouth daily.      esomeprazole 40 MG capsule   Commonly known as: NEXIUM   Take 40 mg by mouth daily before breakfast.      fish oil-omega-3 fatty  acids 1000 MG capsule   Take 1 g by mouth 2 (two) times daily.      methocarbamol 500 MG tablet   Commonly known as: ROBAXIN   Take 1 tablet (500 mg total) by mouth 3 (three) times daily. MAX 3 pills daily      montelukast 10 MG tablet   Commonly known as: SINGULAIR   Take 10 mg by mouth at bedtime.      ondansetron 4 MG tablet   Commonly known as: ZOFRAN   Take 1 tablet (4 mg total) by mouth every 8 (eight) hours as needed for nausea. MAX 3 pills daily      oxyCODONE-acetaminophen 10-325 MG per tablet   Commonly known as: PERCOCET   Take 1 tablet by mouth every 6 (six) hours as needed for pain. MAX 4 pills daily      pilocarpine 5 MG tablet   Commonly known as: SALAGEN   Take 5 mg by mouth 3 (three) times daily.      polyethylene glycol packet   Commonly known as: MIRALAX / GLYCOLAX   Take 17 g by mouth daily. Take 1 packet daily until bowels become regular      potassium chloride SA 20 MEQ tablet   Commonly known as: K-DUR,KLOR-CON   Take 20 mEq by mouth 2 (two) times daily.      rOPINIRole 1 MG tablet   Commonly known as: REQUIP   Take 2 mg by mouth at bedtime.      tolterodine 4 MG 24 hr capsule   Commonly known as: DETROL LA   Take 4 mg by mouth daily.      torsemide 20 MG tablet   Commonly known as: DEMADEX   Take 20 mg by mouth 2 (two) times daily.      Vitamin D 2000 UNITS Caps   Take 2,000 Units by mouth every evening.      zolpidem 10 MG tablet   Commonly known as: AMBIEN   Take 10 mg by mouth at bedtime as needed. For sleep            Diagnostic Studies:  Chest 2 View  03/28/2012  *RADIOLOGY REPORT*  Clinical Data: Preop.  CHEST - 2 VIEW  Comparison: 06/25/2011.  Findings: Trachea is midline.  Heart size normal.  There are calcified mediastinal lymph nodes.  Biapical pleural parenchymal scarring.  Scattered calcified granulomas in the lungs.  Minimal scarring at the lung bases.  No pleural fluid.  Moderate hiatal hernia.  IMPRESSION:  1.  No acute  findings. 2.  Moderate hiatal hernia.   Original Report Authenticated By: Reyes Ivan, M.D.    Dg Lumbar Spine 2-3 Views  04/08/2012  *RADIOLOGY REPORT*  Clinical Data: Follow up of lumbar spine surgery.  Numbness in left leg with cramping.  LUMBAR SPINE - 2-3 VIEW  Comparison: 04/06/2012 intraoperative imaging.  MRI of 03/15/2012.  Findings: AP and lateral views.  Minimal convex left lumbar spine curvature.  Left-sided trans pedicle screw fixation at L3-L4. No acute hardware complication.  Persistent L3-L4 anterolisthesis of approximately 6 mm.  Severe degenerative disc disease at the lumbosacral junction and moderate degenerative disc disease at the L4-L5 level.  IMPRESSION: Similar appearance of left-sided fixation at L3-L4.  No acute hardware complication.   Original Report Authenticated By: Consuello Bossier, M.D.    Dg Lumbar Spine 2-3 Views  03/28/2012  *RADIOLOGY REPORT*  Clinical Data: Preop lumbar fusion.  LUMBAR SPINE - 2-3 VIEW  Comparison: MR lumbar spine 03/15/2012.  Findings: Numbering system utilized on 03/15/2012 is preserved. There is levoconvex scoliosis centered at L2-3.  Osseous interbody fusion at L4-5.  Grade 1 anterolisthesis of L3 on L4, measuring approximately 7 mm, with endplate degenerative changes, loss of disc space height and facet hypertrophy.  Alignment is otherwise anatomic.  Endplate degenerative changes and loss of disc space height are seen at L5-S1.  Diffuse facet hypertrophy throughout the lumbar spine.  Atherosclerotic calcification of the arterial vasculature.  IMPRESSION: Multilevel spondylosis, worst at L3-4 and L5-S1.   Original Report Authenticated By: Reyes Ivan, M.D.    Dg Lumbar Spine Complete  04/06/2012  *RADIOLOGY REPORT*  Clinical Data: Back pain  DG C-ARM GT 120 MIN,LUMBAR SPINE - COMPLETE 4+ VIEW  Comparison: 03/28/2012  Findings: C-arm films document L3-4 XLIF with unilateral left-sided L3 and L4 pedicle screw construct.  Moderate  anterolisthesis L3 on L4 persists.  IMPRESSION: As above.   Original Report Authenticated By: Elsie Stain, M.D.    Dg C-arm Gt 120 Min  04/06/2012  *RADIOLOGY REPORT*  Clinical Data: Back pain  DG C-ARM GT 120 MIN,LUMBAR SPINE - COMPLETE 4+ VIEW  Comparison: 03/28/2012  Findings: C-arm films document L3-4 XLIF with unilateral left-sided L3 and L4 pedicle screw construct.  Moderate anterolisthesis L3 on L4 persists.  IMPRESSION: As above.   Original Report Authenticated By: Elsie Stain, M.D.     Discharge Orders    Future Orders Please Complete By Expires   Diet - low sodium heart healthy      Face-to-face encounter (required for Medicare/Medicaid patients)      Comments:   I Gwinda Maine certify that this patient is under my care and that I, or a nurse practitioner or physician's assistant working with me, had a face-to-face encounter that meets the physician face-to-face encounter requirements with this patient on 04/08/2012.   Questions: Responses:   The encounter with the patient was in whole, or in part, for the following medical condition, which is the primary reason for home health care Degenerative disease lumbar spine s/p XLIF L3-4   I certify that, based on my findings, the following services are medically necessary home health services Physical therapy   My clinical findings support the need for the above services OTHER SEE COMMENTS   Further, I certify that my clinical findings support that this patient is homebound due to: Ambulates short distances less than 300 feet   To provide the following care/treatments PT    OT  Call MD / Call 911      Comments:   If you experience chest pain or shortness of breath, CALL 911 and be transported to the hospital emergency room.  If you develope a fever above 101 F, pus (white drainage) or increased drainage or redness at the wound, or calf pain, call your surgeon's office.   Constipation Prevention      Comments:   Drink plenty of  fluids.  Prune juice may be helpful.  You may use a stool softener, such as Colace (over the counter) 100 mg twice a day.  Use MiraLax (over the counter) for constipation as needed.   Increase activity slowly as tolerated      Discharge instructions      Comments:   Keep incision clean and dry.  Leave steri strips in place.  May shower 5 days from surgery; pat to dry following shower.  May redress with clean, dry dressing if you would like.  Do not apply any lotion/cream/ointment to the incision.   Driving restrictions      Comments:   No driving for 2 weeks.  Dr Shon Baton will discuss addition driving restrictions at your first post-op visit in 2 weeks.   Lifting restrictions      Comments:   No lifting anything greater than 5 pounds.  DO NOT reach overhead (above shoulder height).  NO bending, stooping or squatting.  Dr. Shon Baton will discuss additional lifting restrictions at your first post-op visit in 2 weeks.      Follow-up Information    Follow up with Alvy Beal, MD in 2 weeks.   Contact information:   [facility] [address] [place] [address] [phone]          Discharge Plan:  discharge to Skilled Nursing Facility/Rehab   Disposition: stable at the time of discharge     Signed: Gwinda Maine for Dr. Venita Lick [facility] [phone] 04/08/2012, 2:32 PM

## 2012-04-08 NOTE — Clinical Social Work Placement (Addendum)
    Clinical Social Work Department CLINICAL SOCIAL WORK PLACEMENT NOTE 04/08/2012  Patient:  [name], [name]  Account Number:  [id number] Admit date:  04/06/2012  Clinical Social Worker:  Lupita Leash Moriya Mitchell, BSW  Date/time:  04/07/2012 06:10 PM  Clinical Social Work is seeking post-discharge placement for this patient at the following level of care:   SKILLED NURSING   (*CSW will update this form in Epic as items are completed)     Patient/family provided with [facility] Department of Clinical Social Work's list of facilities offering this level of care within the geographic area requested by the patient (or if unable, by the patient's family).    Patient/family informed of their freedom to choose among providers that offer the needed level of care, that participate in Medicare, Medicaid or managed care program needed by the patient, have an available bed and are willing to accept the patient.    Patient/family informed of [facility]' ownership interest in [facility], as well as of the fact that they are under no obligation to receive care at this facility.  PASARR submitted to EDS on  Has existing PASARR PASARR number received from EDS on   FL2 transmitted to all facilities in geographic area requested by pt/family on  04/08/12 FL2 transmitted to all facilities within larger geographic area on   Patient informed that his/her managed care company has contracts with or will negotiate with  certain facilities, including the following:     Patient/family informed of bed offers received:  04/08/12 Patient chooses bed at  [facility] Physician recommends and patient chooses bed at  [facility]  Patient to be transferred to [facility]  on  04/09/12 Patient to be transferred to facility by Ambulance Sharin Mons)  The following physician request were entered in Epic:   Additional Comments: Patient is pleased with d/c plan and arrangements.  DC summary and  AVS sent to facility via [facility].  Lorri Frederick. [place]  [phone]

## 2012-04-08 NOTE — Progress Notes (Signed)
Physical Therapy Treatment Patient Details Name: KRISTOPHER DELK MRN: [id number] DOB: 1930-07-10 Today's Date: 04/08/2012 Time: 7829-5621 PT Time Calculation (min): 12 min  PT Assessment / Plan / Recommendation Comments on Treatment Session  Pt progressing well. Pt still with LLE in flexion during gait causing a motion similar to buckling during stance phase, pt attributed to previous deficits. Continue per plan.    Follow Up Recommendations  Skilled nursing facility    Barriers to Discharge        Equipment Recommendations  Defer to next venue    Recommendations for Other Services    Frequency Min 5X/week   Plan Discharge plan remains appropriate;Frequency remains appropriate    Precautions / Restrictions Precautions Precautions: Back Precaution Booklet Issued: Yes (comment) Precaution Comments: Able to recall 2/3 precautions Required Braces or Orthoses: Spinal Brace Spinal Brace: Applied in sitting position Restrictions Weight Bearing Restrictions: No   Pertinent Vitals/Pain Minimal pain complaints this session, 3-4/10.    Mobility  Bed Mobility Bed Mobility: Not assessed Transfers Transfers: Sit to Stand;Stand to Sit Sit to Stand: 4: Min guard;With upper extremity assist;From chair/3-in-1;From toilet Stand to Sit: 4: Min guard;With upper extremity assist;To chair/3-in-1;To toilet Details for Transfer Assistance: VC for safe hand placement and safety while maintaining back precautions Ambulation/Gait Ambulation/Gait Assistance: 4: Min guard Ambulation Distance (Feet): 150 Feet Assistive device: Rolling walker Ambulation/Gait Assistance Details: VC for safe distance to RW as well as proper technique during turning.  Left knee maintains knee flexion during stance phase Gait Pattern: Step-through pattern;Decreased stride length;Left flexed knee in stance;Antalgic;Lateral hip instability;Trunk flexed Gait velocity: decreased General Gait Details: Pt presents with  increased knee flexion in LLE during ambulation which causes a near buckling of the left knee during ambulation    Exercises     PT Diagnosis:    PT Problem List:   PT Treatment Interventions:     PT Goals Acute Rehab PT Goals PT Goal: Ambulate - Progress: Progressing toward goal  Visit Information  Last PT Received On: 04/08/12 Assistance Needed: +1    Subjective Data  Subjective: my right hip hurts where they did my surgery   Cognition  Overall Cognitive Status: Appears within functional limits for tasks assessed/performed Arousal/Alertness: Awake/alert Orientation Level: Oriented X4 / Intact;Appears intact for tasks assessed Behavior During Session: [facility] for tasks performed    Balance     End of Session PT - End of Session Equipment Utilized During Treatment: Gait belt;Back brace Activity Tolerance: Patient tolerated treatment well Patient left: in chair;with call bell/phone within reach Nurse Communication: Mobility status   GP     Milana Kidney 04/08/2012, 5:30 PM  04/08/2012 Milana Kidney DPT PAGER: [phone] OFFICE: [phone]

## 2012-04-08 NOTE — Progress Notes (Signed)
    Subjective: Procedure(s) (LRB): ANTERIOR LATERAL LUMBAR FUSION 1 LEVEL (N/A) 2 Days Post-Op  Patient reports pain as 3 on 0-10 scale.  Reports decreased leg pain denies incisional back pain   Positive void Positive bowel movement Positive flatus Negative chest pain or shortness of breath  Objective: Vital signs in last 24 hours: Temp:  [97.5 F (36.4 C)-98.2 F (36.8 C)] 98.2 F (36.8 C) (08/30 0512) Pulse Rate:  [59-70] 59  (08/30 0512) Resp:  [16-18] 16  (08/30 0512) BP: (125-134)/(46-61) 125/46 mmHg (08/30 0512) SpO2:  [95 %-99 %] 95 % (08/30 0512)  Intake/Output from previous day:    Labs: No results found for this basename: WBC:2,RBC:2,HCT:2,PLT:2 in the last 72 hours No results found for this basename: NA:2,K:2,CL:2,CO2:2,BUN:2,CREATININE:2,GLUCOSE:2,CALCIUM:2 in the last 72 hours No results found for this basename: LABPT:2,INR:2 in the last 72 hours  Physical Exam: Neurologically intact ABD soft Neurovascular intact Incision: dressing C/D/I and no drainage Compartment soft  Assessment/Plan: Patient stable  xrays pending Continue mobilization with physical therapy Continue care  Up with therapy Discharge home with home health Will check xrays prior to D/C   Venita Lick, MD [facility] [phone]

## 2012-04-08 NOTE — Clinical Social Work Psychosocial (Addendum)
    Clinical Social Work Department BRIEF PSYCHOSOCIAL ASSESSMENT 04/08/2012  Patient:  [name], [name]     Account Number:  [id number]     Admit date:  04/06/2012  Clinical Social Worker:  Burnard Hawthorne  Date/Time:  04/07/2012 06:00 PM  Referred by:  Physician  Date Referred:  04/06/2012 Referred for  SNF Placement   Other Referral:   Interview type:  Patient Other interview type:    PSYCHOSOCIAL DATA Living Status:  ALONE Admitted from facility:   Level of care:   Primary support name:  [name]   (son) [phone] Primary support relationship to patient:  CHILD, ADULT Degree of support available:   Strong support    CURRENT CONCERNS Current Concerns  Post-Acute Placement   Other Concerns:    SOCIAL WORK ASSESSMENT / PLAN Patient lives alone. She has pre-chosen [facility] as facility of choice for short term SNF. Patient lives alone and does not have anyone to stay with her at home.  Fl2 will be initiated and forwarded to facility. Contacted SNF- bed will be available for patient when medically stable.   Assessment/plan status:  Psychosocial Support/Ongoing Assessment of Needs Other assessment/ plan:   Information/referral to community resources:   SNF list deferred as she has prechosen SNF of choice  Possible aftercare needs discussed; HH/DME needs will be arranged as needed by SNF.    PATIENT'S/FAMILY'S RESPONSE TO PLAN OF CARE: Patient is alert, oriented and very pleasant. She is pleased with d/c plan to go to [facility] and feels that she will be able to manage well at home after rehab staby.  Patient was appreciative of CSW's assistance.

## 2012-04-09 NOTE — Progress Notes (Signed)
Patient was discharged SNF.  All belongings taken with her.  EMS confirmed all orders and prescriptions are maintained.  Patient will be followed with PT, OT, and medical staff at SNF.   [name] N

## 2012-04-09 NOTE — Progress Notes (Signed)
Patient ID: [name], female   DOB: 07-28-1930, 78 y.o.   MRN: [id number]  Doing fine. No events  Surgical pain present but pre-operative pain improved  Dressing dry  D/C to SNF today

## 2012-04-09 NOTE — Progress Notes (Signed)
Pt to transfer to [facility] today via [place]. Pt aware of d/c and reports she will notify her son. D/C packet complete with chart copy, signed FL2, and signed hard Rx. CSW signing off as no other CSW needs identified.  Dellie Burns, MSW, LCSWA [phone] (Weekends 8:00am-4:30pm)

## 2012-11-11 ENCOUNTER — Other Ambulatory Visit: Payer: Self-pay | Admitting: Orthopedic Surgery

## 2012-11-11 MED ORDER — BUPIVACAINE LIPOSOME 1.3 % IJ SUSP: 20.0000 mL | Freq: Once | INTRAMUSCULAR | Status: DC

## 2012-11-11 MED ORDER — DEXAMETHASONE SODIUM PHOSPHATE 10 MG/ML IJ SOLN: 10.0000 mg | Freq: Once | INTRAMUSCULAR | Status: DC

## 2012-11-11 NOTE — Progress Notes (Signed)
Preoperative surgical orders have been place into the Epic hospital system for [name] on 11/11/2012, 4:09 PM  by Patrica Duel for surgery on 11/23/2012.  Preop Hip orders including Experel Injecion, IV Tylenol, and IV Decadron as long as there are no contraindications to the above medications. Avel Peace, PA-C

## 2012-11-14 ENCOUNTER — Encounter (HOSPITAL_COMMUNITY): Payer: Self-pay | Admitting: Pharmacy Technician

## 2012-11-15 ENCOUNTER — Encounter (HOSPITAL_COMMUNITY)
Admission: RE | Admit: 2012-11-15 | Discharge: 2012-11-15 | Disposition: A | Discharge status: Home / Self Care | Payer: Medicare Other | Source: Ambulatory Visit | Attending: Orthopedic Surgery | Admitting: Orthopedic Surgery

## 2012-11-15 ENCOUNTER — Encounter (HOSPITAL_COMMUNITY): Payer: Self-pay

## 2012-11-15 LAB — CBC
HCT: 38.2 % (ref 36.0–46.0)
Hemoglobin: 12.5 g/dL (ref 12.0–15.0)
MCH: 27.7 pg (ref 26.0–34.0)
MCHC: 32.7 g/dL (ref 30.0–36.0)
RBC: 4.52 MIL/uL (ref 3.87–5.11)

## 2012-11-15 LAB — BASIC METABOLIC PANEL
BUN: 17 mg/dL (ref 6–23)
Chloride: 100 mEq/L (ref 96–112)
GFR calc non Af Amer: 50 mL/min — ABNORMAL LOW (ref >90)
Glucose, Bld: 96 mg/dL (ref 70–99)
Potassium: 3.9 mEq/L (ref 3.5–5.1)
Sodium: 142 mEq/L (ref 135–145)

## 2012-11-15 LAB — SURGICAL PCR SCREEN
MRSA, PCR: NEGATIVE
Staphylococcus aureus: NEGATIVE

## 2012-11-15 NOTE — Patient Instructions (Addendum)
20 [name]  11/15/2012   Your procedure is scheduled on: 4-16- -2014  Report to [facility] at    1:00     PM.  Call this number if you have problems the morning of surgery: [phone]  Or Presurgical Testing [phone]([name])      Do not eat food:After Midnight.  May have clear liquids:up to 6 Hours before arrival. Nothing after : 1000 AM.  Clear liquids include soda, tea, black coffee, apple or grape juice, broth.  Take these medicines the morning of surgery with A SIP OF WATER: Nexium.  Use Combivent inhaler and bring.   Do not wear jewelry, make-up or nail polish.  Do not wear lotions, powders, or perfumes. You may wear deodorant.  Do not shave 12 hours prior to first CHG shower(legs and under arms).(face and neck okay.)  Do not bring valuables to the hospital.  Contacts, dentures or bridgework,body piercing,  may not be worn into surgery.  Leave suitcase in the car. After surgery it may be brought to your room.  For patients admitted to the hospital, checkout time is 11:00 AM the day of discharge.   Patients discharged the day of surgery will not be allowed to drive home. Must have responsible person with you x 24 hours once discharged.  Name and phone number of your driver: son - [name]- [phone] cell  Special Instructions: CHG(Chlorhedine 4%-"Hibiclens","Betasept","Aplicare") Shower Use Special Wash: see special instructions.(avoid face and genitals)   Please read over the following fact sheets that you were given: MRSA Information, Incentive Spirometry Instruction.    Failure to follow these instructions may result in Cancellation of your surgery.   Patient signature_______________________________________________________

## 2012-11-15 NOTE — Pre-Procedure Instructions (Addendum)
11-15-12 EKG,CXR- 03-28-12-Epic. Stress 7'12-report with chart.W. Kennon Portela 11-15-12 Dr. Lequita Halt notified of pt's refusal to permit blood transfusions([employer]). Saunders Glance

## 2012-11-15 NOTE — Progress Notes (Signed)
11-15-12 Pt. Signed "Refusal to Permit Blood Transfusions/Products".

## 2012-11-17 ENCOUNTER — Other Ambulatory Visit: Payer: Self-pay | Admitting: Orthopedic Surgery

## 2012-11-23 ENCOUNTER — Encounter (HOSPITAL_COMMUNITY): Payer: Self-pay | Admitting: *Deleted

## 2012-11-23 ENCOUNTER — Observation Stay (HOSPITAL_COMMUNITY)
Admission: RE | Admit: 2012-11-23 | Discharge: 2012-11-24 | Disposition: A | Discharge status: Home / Self Care | Payer: Medicare Other | Source: Ambulatory Visit | Attending: Orthopedic Surgery | Admitting: Orthopedic Surgery

## 2012-11-23 ENCOUNTER — Encounter (HOSPITAL_COMMUNITY)
Admission: RE | Disposition: A | Discharge status: Home / Self Care | Payer: Self-pay | Source: Ambulatory Visit | Attending: Orthopedic Surgery

## 2012-11-23 ENCOUNTER — Ambulatory Visit (HOSPITAL_COMMUNITY): Payer: Medicare Other | Admitting: Anesthesiology

## 2012-11-23 ENCOUNTER — Encounter (HOSPITAL_COMMUNITY): Payer: Self-pay | Admitting: Anesthesiology

## 2012-11-23 DIAGNOSIS — I10 Essential (primary) hypertension: Secondary | ICD-10-CM | POA: Insufficient documentation

## 2012-11-23 DIAGNOSIS — J4489 Other specified chronic obstructive pulmonary disease: Secondary | ICD-10-CM | POA: Insufficient documentation

## 2012-11-23 DIAGNOSIS — J449 Chronic obstructive pulmonary disease, unspecified: Secondary | ICD-10-CM | POA: Insufficient documentation

## 2012-11-23 DIAGNOSIS — X58XXXA Exposure to other specified factors, initial encounter: Secondary | ICD-10-CM | POA: Insufficient documentation

## 2012-11-23 DIAGNOSIS — M7072 Other bursitis of hip, left hip: Secondary | ICD-10-CM | POA: Diagnosis present

## 2012-11-23 DIAGNOSIS — IMO0002 Reserved for concepts with insufficient information to code with codable children: Secondary | ICD-10-CM | POA: Insufficient documentation

## 2012-11-23 DIAGNOSIS — K219 Gastro-esophageal reflux disease without esophagitis: Secondary | ICD-10-CM | POA: Insufficient documentation

## 2012-11-23 DIAGNOSIS — I2510 Atherosclerotic heart disease of native coronary artery without angina pectoris: Secondary | ICD-10-CM | POA: Insufficient documentation

## 2012-11-23 DIAGNOSIS — E7800 Pure hypercholesterolemia, unspecified: Secondary | ICD-10-CM | POA: Insufficient documentation

## 2012-11-23 DIAGNOSIS — Z79899 Other long term (current) drug therapy: Secondary | ICD-10-CM | POA: Insufficient documentation

## 2012-11-23 DIAGNOSIS — M76899 Other specified enthesopathies of unspecified lower limb, excluding foot: Principal | ICD-10-CM | POA: Insufficient documentation

## 2012-11-23 HISTORY — PX: EXCISION/RELEASE BURSA HIP: SHX5014

## 2012-11-23 SURGERY — RELEASE, BURSA, TROCHANTERIC
Anesthesia: General | Site: Hip | Laterality: Right | Wound class: Clean

## 2012-11-23 MED ORDER — FENTANYL CITRATE 0.05 MG/ML IJ SOLN
INTRAMUSCULAR | Status: AC
  Administered 2012-11-23: 100 ug via INTRAVENOUS
  Filled 2012-11-23: qty 2

## 2012-11-23 MED ORDER — ACETAMINOPHEN 10 MG/ML IV SOLN
INTRAVENOUS | Status: AC
  Filled 2012-11-23: qty 100

## 2012-11-23 MED ORDER — KETOROLAC TROMETHAMINE 15 MG/ML IJ SOLN
15.0000 mg | Freq: Once | INTRAMUSCULAR | Status: AC
  Administered 2012-11-23: 15 mg via INTRAVENOUS

## 2012-11-23 MED ORDER — MORPHINE SULFATE 2 MG/ML IJ SOLN
1.0000 mg | INTRAMUSCULAR | Status: DC | PRN
  Administered 2012-11-23: 2 mg via INTRAVENOUS
  Administered 2012-11-23: 1 mg via INTRAVENOUS
  Administered 2012-11-24 (×2): 2 mg via INTRAVENOUS
  Filled 2012-11-23 (×4): qty 1

## 2012-11-23 MED ORDER — PROMETHAZINE HCL 25 MG/ML IJ SOLN: 6.2500 mg | INTRAMUSCULAR | Status: DC | PRN

## 2012-11-23 MED ORDER — ONDANSETRON HCL 4 MG/2ML IJ SOLN: 4.0000 mg | Freq: Four times a day (QID) | INTRAMUSCULAR | Status: DC | PRN

## 2012-11-23 MED ORDER — ACETAMINOPHEN 10 MG/ML IV SOLN: 1000.0000 mg | Freq: Once | INTRAVENOUS | Status: DC | PRN

## 2012-11-23 MED ORDER — POLYETHYLENE GLYCOL 3350 17 G PO PACK: 17.0000 g | PACK | Freq: Every day | ORAL | Status: DC | PRN

## 2012-11-23 MED ORDER — MIRTAZAPINE 15 MG PO TABS
15.0000 mg | ORAL_TABLET | Freq: Every day | ORAL | Status: DC
  Administered 2012-11-23: 15 mg via ORAL
  Filled 2012-11-23 (×2): qty 1

## 2012-11-23 MED ORDER — ZOLPIDEM TARTRATE 10 MG PO TABS
5.0000 mg | ORAL_TABLET | Freq: Every evening | ORAL | Status: DC | PRN
  Administered 2012-11-24: 5 mg via ORAL
  Filled 2012-11-23: qty 1

## 2012-11-23 MED ORDER — IPRATROPIUM-ALBUTEROL 20-100 MCG/ACT IN AERS
2.0000 | INHALATION_SPRAY | Freq: Three times a day (TID) | RESPIRATORY_TRACT | Status: DC | PRN
  Filled 2012-11-23: qty 4

## 2012-11-23 MED ORDER — METHOCARBAMOL 500 MG PO TABS
500.0000 mg | ORAL_TABLET | Freq: Four times a day (QID) | ORAL | Status: DC | PRN
  Administered 2012-11-23 – 2012-11-24 (×2): 500 mg via ORAL
  Filled 2012-11-23 (×2): qty 1

## 2012-11-23 MED ORDER — HYDROMORPHONE HCL PF 1 MG/ML IJ SOLN
INTRAMUSCULAR | Status: AC
  Filled 2012-11-23: qty 1

## 2012-11-23 MED ORDER — ONDANSETRON HCL 4 MG/2ML IJ SOLN
INTRAMUSCULAR | Status: DC | PRN
  Administered 2012-11-23: 4 mg via INTRAVENOUS

## 2012-11-23 MED ORDER — BUPIVACAINE LIPOSOME 1.3 % IJ SUSP
20.0000 mL | Freq: Once | INTRAMUSCULAR | Status: DC
  Filled 2012-11-23: qty 20

## 2012-11-23 MED ORDER — DIAZEPAM 5 MG/ML IJ SOLN
5.0000 mg | Freq: Once | INTRAMUSCULAR | Status: AC | PRN
  Administered 2012-11-23: 5 mg via INTRAVENOUS

## 2012-11-23 MED ORDER — IPRATROPIUM-ALBUTEROL 18-103 MCG/ACT IN AERO
2.0000 | INHALATION_SPRAY | Freq: Three times a day (TID) | RESPIRATORY_TRACT | Status: DC | PRN
  Filled 2012-11-23: qty 14.7

## 2012-11-23 MED ORDER — SUCCINYLCHOLINE CHLORIDE 20 MG/ML IJ SOLN
INTRAMUSCULAR | Status: DC | PRN
  Administered 2012-11-23: 100 mg via INTRAVENOUS

## 2012-11-23 MED ORDER — METOCLOPRAMIDE HCL 5 MG/ML IJ SOLN: 5.0000 mg | Freq: Three times a day (TID) | INTRAMUSCULAR | Status: DC | PRN

## 2012-11-23 MED ORDER — ACETAMINOPHEN 10 MG/ML IV SOLN
1000.0000 mg | Freq: Once | INTRAVENOUS | Status: AC
  Administered 2012-11-23: 1000 mg via INTRAVENOUS

## 2012-11-23 MED ORDER — TORSEMIDE 20 MG PO TABS
20.0000 mg | ORAL_TABLET | Freq: Two times a day (BID) | ORAL | Status: DC
  Administered 2012-11-23 – 2012-11-24 (×2): 20 mg via ORAL
  Filled 2012-11-23 (×3): qty 1

## 2012-11-23 MED ORDER — SODIUM CHLORIDE 0.9 % IV SOLN: INTRAVENOUS | Status: DC

## 2012-11-23 MED ORDER — HYDROMORPHONE HCL PF 1 MG/ML IJ SOLN: 0.5000 mg | INTRAMUSCULAR | Status: DC | PRN

## 2012-11-23 MED ORDER — 0.9 % SODIUM CHLORIDE (POUR BTL) OPTIME
TOPICAL | Status: DC | PRN
  Administered 2012-11-23: 1000 mL

## 2012-11-23 MED ORDER — BLISTEX EX OINT
TOPICAL_OINTMENT | CUTANEOUS | Status: AC
  Administered 2012-11-23: 21:00:00
  Filled 2012-11-23: qty 10

## 2012-11-23 MED ORDER — PANTOPRAZOLE SODIUM 40 MG PO TBEC
80.0000 mg | DELAYED_RELEASE_TABLET | Freq: Two times a day (BID) | ORAL | Status: DC
  Administered 2012-11-24: 80 mg via ORAL
  Filled 2012-11-23 (×3): qty 2

## 2012-11-23 MED ORDER — LIDOCAINE HCL (PF) 2 % IJ SOLN
INTRAMUSCULAR | Status: DC | PRN
  Administered 2012-11-23: 20 mg

## 2012-11-23 MED ORDER — HYDROCODONE-ACETAMINOPHEN 7.5-325 MG PO TABS
1.0000 | ORAL_TABLET | ORAL | Status: DC | PRN
  Administered 2012-11-23: 1 via ORAL
  Administered 2012-11-24: 2 via ORAL
  Filled 2012-11-23: qty 2
  Filled 2012-11-23: qty 1
  Filled 2012-11-23: qty 2

## 2012-11-23 MED ORDER — CEFAZOLIN SODIUM-DEXTROSE 2-3 GM-% IV SOLR
2.0000 g | INTRAVENOUS | Status: AC
  Administered 2012-11-23: 2 g via INTRAVENOUS

## 2012-11-23 MED ORDER — DIAZEPAM 5 MG/ML IJ SOLN
INTRAMUSCULAR | Status: AC
  Filled 2012-11-23: qty 2

## 2012-11-23 MED ORDER — MEPERIDINE HCL 50 MG/ML IJ SOLN: 6.2500 mg | INTRAMUSCULAR | Status: DC | PRN

## 2012-11-23 MED ORDER — PROPOFOL 10 MG/ML IV BOLUS
INTRAVENOUS | Status: DC | PRN
  Administered 2012-11-23: 180 mg via INTRAVENOUS

## 2012-11-23 MED ORDER — SODIUM CHLORIDE 0.9 % IV SOLN
INTRAVENOUS | Status: DC
  Administered 2012-11-24: 04:00:00 via INTRAVENOUS

## 2012-11-23 MED ORDER — SODIUM CHLORIDE 0.9 % IJ SOLN
INTRAMUSCULAR | Status: DC | PRN
  Administered 2012-11-23: 17:00:00

## 2012-11-23 MED ORDER — CHLORHEXIDINE GLUCONATE 4 % EX LIQD: 60.0000 mL | Freq: Once | CUTANEOUS | Status: DC

## 2012-11-23 MED ORDER — ATORVASTATIN CALCIUM 10 MG PO TABS
10.0000 mg | ORAL_TABLET | Freq: Every day | ORAL | Status: DC
  Administered 2012-11-23: 10 mg via ORAL
  Filled 2012-11-23 (×2): qty 1

## 2012-11-23 MED ORDER — MONTELUKAST SODIUM 10 MG PO TABS
10.0000 mg | ORAL_TABLET | Freq: Every day | ORAL | Status: DC
  Administered 2012-11-23: 10 mg via ORAL
  Filled 2012-11-23 (×2): qty 1

## 2012-11-23 MED ORDER — ENOXAPARIN SODIUM 40 MG/0.4ML ~~LOC~~ SOLN
40.0000 mg | SUBCUTANEOUS | Status: DC
  Administered 2012-11-24: 40 mg via SUBCUTANEOUS
  Filled 2012-11-23 (×2): qty 0.4

## 2012-11-23 MED ORDER — METOCLOPRAMIDE HCL 10 MG PO TABS: 5.0000 mg | ORAL_TABLET | Freq: Three times a day (TID) | ORAL | Status: DC | PRN

## 2012-11-23 MED ORDER — ONDANSETRON HCL 4 MG PO TABS: 4.0000 mg | ORAL_TABLET | Freq: Four times a day (QID) | ORAL | Status: DC | PRN

## 2012-11-23 MED ORDER — LACTATED RINGERS IV SOLN
INTRAVENOUS | Status: DC
  Administered 2012-11-23: 16:00:00 via INTRAVENOUS
  Administered 2012-11-23: 1000 mL via INTRAVENOUS

## 2012-11-23 MED ORDER — FENTANYL CITRATE 0.05 MG/ML IJ SOLN
INTRAMUSCULAR | Status: DC | PRN
  Administered 2012-11-23: 50 ug via INTRAVENOUS
  Administered 2012-11-23: 100 ug via INTRAVENOUS
  Administered 2012-11-23: 50 ug via INTRAVENOUS

## 2012-11-23 MED ORDER — FESOTERODINE FUMARATE ER 8 MG PO TB24
8.0000 mg | ORAL_TABLET | Freq: Every day | ORAL | Status: DC
  Administered 2012-11-23: 8 mg via ORAL
  Filled 2012-11-23 (×2): qty 1

## 2012-11-23 MED ORDER — HYDROMORPHONE HCL PF 1 MG/ML IJ SOLN
0.2500 mg | INTRAMUSCULAR | Status: DC | PRN
  Administered 2012-11-23: 0.25 mg via INTRAVENOUS
  Administered 2012-11-23: 0.5 mg via INTRAVENOUS
  Administered 2012-11-23: 0.25 mg via INTRAVENOUS
  Administered 2012-11-23: 1 mg via INTRAVENOUS

## 2012-11-23 MED ORDER — POTASSIUM CHLORIDE CRYS ER 20 MEQ PO TBCR
20.0000 meq | EXTENDED_RELEASE_TABLET | Freq: Two times a day (BID) | ORAL | Status: DC
  Administered 2012-11-23 – 2012-11-24 (×2): 20 meq via ORAL
  Filled 2012-11-23 (×3): qty 1

## 2012-11-23 MED ORDER — CEFAZOLIN SODIUM 1-5 GM-% IV SOLN
1.0000 g | Freq: Four times a day (QID) | INTRAVENOUS | Status: AC
  Administered 2012-11-23 – 2012-11-24 (×3): 1 g via INTRAVENOUS
  Filled 2012-11-23 (×3): qty 50

## 2012-11-23 MED ORDER — CEFAZOLIN SODIUM-DEXTROSE 2-3 GM-% IV SOLR
INTRAVENOUS | Status: AC
  Filled 2012-11-23: qty 50

## 2012-11-23 MED ORDER — PILOCARPINE HCL 5 MG PO TABS
5.0000 mg | ORAL_TABLET | Freq: Three times a day (TID) | ORAL | Status: DC
  Administered 2012-11-23 – 2012-11-24 (×2): 5 mg via ORAL
  Filled 2012-11-23 (×4): qty 1

## 2012-11-23 MED ORDER — METHOCARBAMOL 100 MG/ML IJ SOLN: 500.0000 mg | Freq: Four times a day (QID) | INTRAVENOUS | Status: DC | PRN

## 2012-11-23 MED ORDER — KETOROLAC TROMETHAMINE 15 MG/ML IJ SOLN
INTRAMUSCULAR | Status: AC
  Filled 2012-11-23: qty 1

## 2012-11-23 MED ORDER — ROPINIROLE HCL 1 MG PO TABS
1.0000 mg | ORAL_TABLET | Freq: Every day | ORAL | Status: DC
  Administered 2012-11-23: 1 mg via ORAL
  Filled 2012-11-23 (×2): qty 1

## 2012-11-23 SURGICAL SUPPLY — 44 items
ANCHOR SUPER QUICK (Anchor) ×4 IMPLANT
BAG ZIPLOCK 12X15 (MISCELLANEOUS) ×2 IMPLANT
BIT DRILL 2.8 QUICK RELEASE (BIT) ×1 IMPLANT
BLADE EXTENDED COATED 6.5IN (ELECTRODE) IMPLANT
CLOTH BEACON ORANGE TIMEOUT ST (SAFETY) ×2 IMPLANT
DRAPE INCISE IOBAN 66X45 STRL (DRAPES) ×2 IMPLANT
DRAPE ORTHO SPLIT 77X108 STRL (DRAPES) ×2
DRAPE POUCH INSTRU U-SHP 10X18 (DRAPES) ×2 IMPLANT
DRAPE SURG ORHT 6 SPLT 77X108 (DRAPES) ×2 IMPLANT
DRAPE U-SHAPE 47X51 STRL (DRAPES) ×2 IMPLANT
DRILL 2.8 QUICK RELEASE (BIT) ×2
DRSG ADAPTIC 3X8 NADH LF (GAUZE/BANDAGES/DRESSINGS) ×2 IMPLANT
DRSG MEPILEX BORDER 4X4 (GAUZE/BANDAGES/DRESSINGS) ×2 IMPLANT
DRSG MEPILEX BORDER 4X8 (GAUZE/BANDAGES/DRESSINGS) ×2 IMPLANT
DURAPREP 26ML APPLICATOR (WOUND CARE) ×2 IMPLANT
ELECT REM PT RETURN 9FT ADLT (ELECTROSURGICAL) ×2
ELECTRODE REM PT RTRN 9FT ADLT (ELECTROSURGICAL) ×1 IMPLANT
GLOVE BIO SURGEON STRL SZ7.5 (GLOVE) IMPLANT
GLOVE BIO SURGEON STRL SZ8 (GLOVE) ×2 IMPLANT
GLOVE BIOGEL PI IND STRL 8 (GLOVE) ×2 IMPLANT
GLOVE BIOGEL PI INDICATOR 8 (GLOVE) ×2
GOWN STRL NON-REIN LRG LVL3 (GOWN DISPOSABLE) ×4 IMPLANT
GOWN STRL REIN XL XLG (GOWN DISPOSABLE) ×2 IMPLANT
KIT BASIN OR (CUSTOM PROCEDURE TRAY) ×2 IMPLANT
MANIFOLD NEPTUNE II (INSTRUMENTS) ×2 IMPLANT
NDL SAFETY ECLIPSE 18X1.5 (NEEDLE) ×1 IMPLANT
NEEDLE HYPO 18GX1.5 SHARP (NEEDLE) ×1
NS IRRIG 1000ML POUR BTL (IV SOLUTION) ×2 IMPLANT
PACK TOTAL JOINT (CUSTOM PROCEDURE TRAY) ×2 IMPLANT
PASSER SUT SWANSON 36MM LOOP (INSTRUMENTS) ×2 IMPLANT
POSITIONER SURGICAL ARM (MISCELLANEOUS) ×2 IMPLANT
SPONGE GAUZE 4X4 12PLY (GAUZE/BANDAGES/DRESSINGS) ×2 IMPLANT
STAPLER VISISTAT 35W (STAPLE) IMPLANT
STRIP CLOSURE SKIN 1/2X4 (GAUZE/BANDAGES/DRESSINGS) ×4 IMPLANT
SUT ETHIBOND NAB CT1 #1 30IN (SUTURE) ×4 IMPLANT
SUT MNCRL AB 4-0 PS2 18 (SUTURE) ×2 IMPLANT
SUT VIC AB 1 CT1 27 (SUTURE)
SUT VIC AB 1 CT1 27XBRD ANTBC (SUTURE) IMPLANT
SUT VIC AB 2-0 CT1 27 (SUTURE) ×3
SUT VIC AB 2-0 CT1 TAPERPNT 27 (SUTURE) ×3 IMPLANT
SUT VLOC 180 0 24IN GS25 (SUTURE) ×2 IMPLANT
SYR 50ML LL SCALE MARK (SYRINGE) ×2 IMPLANT
TOWEL OR 17X26 10 PK STRL BLUE (TOWEL DISPOSABLE) ×4 IMPLANT
WATER STERILE IRR 1500ML POUR (IV SOLUTION) ×2 IMPLANT

## 2012-11-23 NOTE — Interval H&P Note (Signed)
History and Physical Interval Note:  11/23/2012 4:27 PM  [name]  has presented today for surgery, with the diagnosis of RIGHT HIP INTRACTIBLE BURSITIS AND GLUTENAL TENDON TEAR  The various methods of treatment have been discussed with the patient and family. After consideration of risks, benefits and other options for treatment, the patient has consented to  Procedure(s): RIGHT HIP BURSECTOMY AND TENDON REPAIR (Right) as a surgical intervention .  The patient's history has been reviewed, patient examined, no change in status, stable for surgery.  I have reviewed the patient's chart and labs.  Questions were answered to the patient's satisfaction.     Loanne Drilling

## 2012-11-23 NOTE — Transfer of Care (Signed)
Immediate Anesthesia Transfer of Care Note  Patient: [name]  Procedure(s) Performed: Procedure(s): RIGHT HIP BURSECTOMY AND TENDON REPAIR (Right)  Patient Location: PACU  Anesthesia Type:General  Level of Consciousness: awake, alert , oriented and patient cooperative  Airway & Oxygen Therapy: Patient Spontanous Breathing and Patient connected to face mask oxygen  Post-op Assessment: Report given to PACU RN and Post -op Vital signs reviewed and stable  Post vital signs: Reviewed and stable  Complications: No apparent anesthesia complications

## 2012-11-23 NOTE — Anesthesia Postprocedure Evaluation (Signed)
Anesthesia Post Note  Patient: [name]  Procedure(s) Performed: Procedure(s) (LRB): RIGHT HIP BURSECTOMY AND TENDON REPAIR (Right)  Anesthesia type: General  Patient location: PACU  Post pain: Pain level controlled  Post assessment: Post-op Vital signs reviewed  Last Vitals: BP 158/84  Pulse 68  Temp(Src) 36.7 C (Oral)  Resp 13  SpO2 100%  Post vital signs: Reviewed  Level of consciousness: sedated  Complications: No apparent anesthesia complications

## 2012-11-23 NOTE — Anesthesia Preprocedure Evaluation (Addendum)
Anesthesia Evaluation  Patient identified by MRN, date of birth, ID band Patient awake    Reviewed: Allergy & Precautions, H&P , NPO status , Patient's Chart, lab work & pertinent test results  History of Anesthesia Complications (+) PROLONGED EMERGENCE  Airway Mallampati: II TM Distance: >3 FB Neck ROM: Full    Dental no notable dental hx. (+) Teeth Intact and Dental Advisory Given   Pulmonary shortness of breath and with exertion, asthma , pneumonia -, resolved, COPD COPD inhaler, former smoker,  breath sounds clear to auscultation  Pulmonary exam normal       Cardiovascular hypertension, On Medications + CAD and + Cardiac Stents Rhythm:Regular Rate:Normal     Neuro/Psych negative neurological ROS  negative psych ROS   GI/Hepatic Neg liver ROS, GERD-  Medicated and Controlled,  Endo/Other  negative endocrine ROS  Renal/GU negative Renal ROS     Musculoskeletal  (+) Arthritis -,   Abdominal   Peds  Hematology negative hematology ROS (+) REFUSES BLOOD PRODUCTS, JEHOVAH'S WITNESS  Anesthesia Other Findings   Reproductive/Obstetrics                           Anesthesia Physical  Anesthesia Plan  ASA: III  Anesthesia Plan: General   Post-op Pain Management:    Induction: Intravenous  Airway Management Planned: Oral ETT  Additional Equipment:   Intra-op Plan:   Post-operative Plan: Extubation in OR  Informed Consent: I have reviewed the patients History and Physical, chart, labs and discussed the procedure including the risks, benefits and alternatives for the proposed anesthesia with the patient or authorized representative who has indicated his/her understanding and acceptance.   Dental advisory given  Plan Discussed with: CRNA  Anesthesia Plan Comments:        Anesthesia Quick Evaluation

## 2012-11-23 NOTE — H&P (Signed)
CC- [name] is a 79 y.o. female who presents with right hip pain  Hip Pain: Patient complains of right hip pain. Onset of the symptoms was several months ago. Inciting event: none. Current symptoms include lateral hip pain and difficulty walking.  Aggravating symptoms: going up and down stairs, pivoting and rising after sitting. Patient has had no prior hip problems. Previous visits for this problem: has had multiple injections in past. Evaluation to date: MRI with gluteal tendon tear.  Treatment to date: physical therapy, which has been ineffective and injections which have stopped helping.  Past Medical History  Diagnosis Date  . HOH (hard of hearing)     severe hearing loss- Wears Bilateral hearing aids  . Complication of anesthesia     Blood pressure dropped after hammertoe surgery; difficulty waking up  . Hypertension   . Pneumonia     hx of  . Bronchitis     hx of  . COPD (chronic obstructive pulmonary disease)   . Shortness of breath     With ambulation at times  . Asthma   . Vertigo     hx of  . Syncope 2009    Has not had another episode since 2009  . Urination frequency     excessive at bedtime  . GERD (gastroesophageal reflux disease)   . Arthritis   . Anemia     hx of. None since hysterectomy  . Hypercholesteremia   . Restless leg syndrome   . Coronary artery disease     prox LAD stent '06    Past Surgical History  Procedure Laterality Date  . Abdominal hysterectomy    . Tonsillectomy    . Cholecystectomy    . Appendectomy    . Back surgery    . Joint replacement  05/2011    Left knee replacement  . Colonoscopy    . Inner ear surgery    . Eye surgery  2012    Bilateral cataract removal  . Foot surgery      Right foot hammer toe surgery  . Anterior lat lumbar fusion  04/06/2012    Procedure: ANTERIOR LATERAL LUMBAR FUSION 1 LEVEL;  Surgeon: Venita Lick, MD;  Location: [facility] OR;  Service: Orthopedics;  Laterality: N/A;  XLIF L3-L4 with Posterior  Instrumentation   . Cardiac catheterization    . Coronary angioplasty  2006    Prox LAD    Prior to Admission medications   Medication Sig Start Date End Date Taking? Authorizing Provider  albuterol-ipratropium (COMBIVENT) 18-103 MCG/ACT inhaler Inhale 2 puffs into the lungs 3 (three) times daily as needed for shortness of breath.     Historical Provider, MD  atorvastatin (LIPITOR) 10 MG tablet Take 10 mg by mouth at bedtime.     Historical Provider, MD  esomeprazole (NEXIUM) 40 MG capsule Take 40 mg by mouth 2 (two) times daily.     Historical Provider, MD  fesoterodine (TOVIAZ) 8 MG TB24 Take 8 mg by mouth daily.    Historical Provider, MD  fish oil-omega-3 fatty acids 1000 MG capsule Take 1 g by mouth 2 (two) times daily.    Historical Provider, MD  mirtazapine (REMERON) 15 MG tablet Take 15 mg by mouth at bedtime.    Historical Provider, MD  montelukast (SINGULAIR) 10 MG tablet Take 10 mg by mouth at bedtime.    Historical Provider, MD  oxyCODONE (OXY IR/ROXICODONE) 5 MG immediate release tablet Take 5 mg by mouth every 6 (six) hours as  needed for pain.    Historical Provider, MD  pilocarpine (SALAGEN) 5 MG tablet Take 5 mg by mouth 3 (three) times daily.    Historical Provider, MD  polyethylene glycol (MIRALAX / GLYCOLAX) packet Take 17 g by mouth daily as needed (constipation).    Historical Provider, MD  potassium chloride SA (K-DUR,KLOR-CON) 20 MEQ tablet Take 20 mEq by mouth 2 (two) times daily.    Historical Provider, MD  rOPINIRole (REQUIP) 1 MG tablet Take 1 mg by mouth at bedtime.     Historical Provider, MD  torsemide (DEMADEX) 20 MG tablet Take 20 mg by mouth 2 (two) times daily.    Historical Provider, MD  zolpidem (AMBIEN) 10 MG tablet Take 10 mg by mouth at bedtime as needed for sleep.     Historical Provider, MD    Physical Examination: General appearance - alert, well appearing, and in no distress Mental status - alert, oriented to person, place, and time Chest - clear  to auscultation, no wheezes, rales or rhonchi, symmetric air entry Heart - normal rate, regular rhythm, normal S1, S2, no murmurs, rubs, clicks or gallops Abdomen - soft, nontender, nondistended, no masses or organomegaly Neurological - alert, oriented, normal speech, no focal findings or movement disorder noted  A right hip exam was performed. SKIN: intact SWELLING: none TENDERNESS: severe and focused over greater trochanter ROM: normal STRENGTH: decreased abduction strength GAIT: antalgic  ASSESSMENT:Right hip intractable bursitis with gluteal tendon tear  Plan right hip bursectomy with gluteal tendon repair. Discussed procedure, risks and potential complications with patient who elects to proceed  [name]. Estera Ozier, MD    11/23/2012, 8:32 AM

## 2012-11-23 NOTE — Preoperative (Signed)
Beta Blockers   Reason not to administer Beta Blockers:Not Applicable 

## 2012-11-23 NOTE — Op Note (Signed)
OPERATIVE REPORT   PREOPERATIVE DIAGNOSIS: Intractable bursitis,Right hip with gluteal  tendon tear.   POSTOPERATIVE DIAGNOSIS: Intractable bursitis, Right hip with gluteal  tendon tear.   PROCEDURE: Right  hip bursectomy with gluteal tendon repair.   SURGEON: Ollen Gross, M.D.   ASSISTANT: None   ANESTHESIA: General   ESTIMATED BLOOD LOSS: minimal   DRAINS: None.   COMPLICATIONS: None.   CONDITION: Stable to recovery.   BRIEF CLINICAL NOTE: [name] is an 79 y.o. female  with  significant intractable  Right lateral hip pain. It has been going on for  a long time now. It is getting progressively worse over time. It  had a partial temporary response to a trochanteric injection. MRI showed bursitis and  probable gluteal tendon tear. She presents now for Right hip bursectomy and  gluteal tendon repair.   PROCEDURE IN DETAIL: After successful administration of general  anesthetic, the patient was placed in the Left lateral decubitus  position with the Right  side up and held with the hip positioner. Her   Right lower extremity was isolated from her perineum with plastic drapes  and prepped and draped in the usual sterile fashion. The lateral-based  incision was then made centered over the tip of the greater trochanter  about 4 inches in length. The skin was cut with a #10 blade through  subcutaneous tissue to the fascia lata which incised in line with the  skin incision. There was a fluid-filled bursa present underneath the  fascia lata. I resected this with electrocautery. I palpated the  gluteus medius tendon. There was an obvious tear with retraction   from the greater trochanter. IIn addition, there was a large  anterosuperior spur coming off the trochanter and the gluteus medius had  detached from that spur. The spur was removed with a rongeur. I placed  2 Mitek suture anchors into the tip of the trochanter and reattached the  gluteus medius to a trough created  in the bone utilizing the sutures from the suture anchor.  This was very stable repair. its insertion site and make it more stable.      The wound was then copiously irrigated with  saline solution. The fascia lata was closed with interrupted #1 Vicryl  leaving open a small triangular area over the tip of the greater  trochanter to prevent it from rubbing. Subcu was then closed with #1  and 2-0 Vicryl, subcuticular running 4-0 Monocryl. Note that, 20 mL of  Exparel mixed with 50 mL of saline were injected into the fascia lata,  gluteal muscles, and subcu tissues. The subcuticular layer was closed  with running 4-0 Monocryl, and the incision cleaned and dried. Steri-  Strips and sterile dressing applied. She was then awakened and  transported to recovery in stable condition.   Ollen Gross, M.D.

## 2012-11-24 ENCOUNTER — Encounter (HOSPITAL_COMMUNITY): Payer: Self-pay | Admitting: Orthopedic Surgery

## 2012-11-24 MED ORDER — OXYCODONE HCL 5 MG PO TABS: 5.0000 mg | ORAL_TABLET | Freq: Four times a day (QID) | ORAL | Status: DC | PRN

## 2012-11-24 MED ORDER — OXYCODONE HCL 5 MG PO TABS
5.0000 mg | ORAL_TABLET | Freq: Four times a day (QID) | ORAL | Status: DC | PRN
  Administered 2012-11-24: 5 mg via ORAL
  Filled 2012-11-24: qty 1

## 2012-11-24 MED ORDER — METHOCARBAMOL 500 MG PO TABS: 500.0000 mg | ORAL_TABLET | Freq: Four times a day (QID) | ORAL | Status: DC | PRN

## 2012-11-24 NOTE — Progress Notes (Signed)
[facility]  Patient Status: New  [facility] is providing the following services: DME  patient has received equipment and education in hospital Patient declined 3-n-1 bedside commode - said she had one at home.  She did accept and receive RW in room.  If patient discharges after hours, please call [phone].   Italy Sealey 11/24/2012, 11:20 AM

## 2012-11-24 NOTE — Progress Notes (Signed)
Unable to print AVS. PA notified. Reviewed all patient d/c instructions and patient verbalized understanding. Patient signed sheet left in chart, stating her acceptance. No further questions. Removed IV, intact. Dressing dry and intact. VSS. Pain minimal. Loetta Rough, RN 11/24/2012 12:13 PM

## 2012-11-24 NOTE — Progress Notes (Addendum)
   Subjective: 1 Day Post-Op Procedure(s) (LRB): RIGHT HIP BURSECTOMY AND TENDON REPAIR (Right) Patient reports pain as mild.   Patient seen in rounds by Dr. Lequita Halt. Patient is well, and has had no acute complaints or problems Patient is ready to go home today.  Objective: Vital signs in last 24 hours: Temp:  [97.7 F (36.5 C)-98.7 F (37.1 C)] 97.7 F (36.5 C) (04/17 0454) Pulse Rate:  [60-84] 60 (04/17 0454) Resp:  [12-25] 16 (04/17 0454) BP: (116-179)/(51-110) 121/74 mmHg (04/17 0454) SpO2:  [97 %-100 %] 99 % (04/17 0454) Weight:  [71.1 kg (156 lb 12 oz)] 71.1 kg (156 lb 12 oz) (04/17 0100)  Intake/Output from previous day:  Intake/Output Summary (Last 24 hours) at 11/24/12 0750 Last data filed at 11/24/12 0600  Gross per 24 hour  Intake   2700 ml  Output    500 ml  Net   2200 ml    Intake/Output this shift:    Labs: No results found for this basename: HGB,  in the last 72 hours No results found for this basename: WBC, RBC, HCT, PLT,  in the last 72 hours No results found for this basename: NA, K, CL, CO2, BUN, CREATININE, GLUCOSE, CALCIUM,  in the last 72 hours No results found for this basename: LABPT, INR,  in the last 72 hours  EXAM: General - Patient is Alert, Appropriate and Oriented Extremity - Neurovascular intact Sensation intact distally Dressing - clean, dry, no drainage Motor Function - intact, moving foot and toes well on exam.   Assessment/Plan: 1 Day Post-Op Procedure(s) (LRB): RIGHT HIP BURSECTOMY AND TENDON REPAIR (Right) Procedure(s) (LRB): RIGHT HIP BURSECTOMY AND TENDON REPAIR (Right) Past Medical History  Diagnosis Date  . HOH (hard of hearing)     severe hearing loss- Wears Bilateral hearing aids  . Complication of anesthesia     Blood pressure dropped after hammertoe surgery; difficulty waking up  . Hypertension   . Pneumonia     hx of  . Bronchitis     hx of  . COPD (chronic obstructive pulmonary disease)   . Shortness of  breath     With ambulation at times  . Asthma   . Vertigo     hx of  . Syncope 2009    Has not had another episode since 2009  . Urination frequency     excessive at bedtime  . GERD (gastroesophageal reflux disease)   . Arthritis   . Anemia     hx of. None since hysterectomy  . Hypercholesteremia   . Restless leg syndrome   . Coronary artery disease     prox LAD stent '06   Principal Problem:   Bursitis of left hip  Estimated body mass index is 30.61 kg/(m^2) as calculated from the following:   Height as of this encounter: 5' (1.524 m).   Weight as of this encounter: 71.1 kg (156 lb 12 oz). Discharge home Diet - Cardiac diet Follow up - in 2 weeks Activity - WBAT Disposition - Home Condition Upon Discharge - Good D/C Meds - See DC Summary DVT Prophylaxis - Aspirin daily  PERKINS, ALEXZANDREW 11/24/2012, 7:50 AM

## 2012-11-24 NOTE — Progress Notes (Signed)
Patient preparing for discharge, asked for pain medicine. Took 2 Norco into room (a continuation of her plan of care from previous shifts). Patient adamantly refused Norco, stating "Hydrocodone makes me jittery and it just doesn't work.  That's why I had to keep having Morphine all night.  I want Oxycodone. That's what works for me."  Reminded the patient that her allergy list includes Percocet (Oxy and Acetaminophen), and we cannot give her medications that she states are allergies. Patient was again adamantly stating that its is NOT Percocet, but Norco that is the allergy. Met with and discussed patient's concerns with Avel Peace, PA. Prescriptions for home as well as current [facility] list will be updated to reflect patient's wishes. Patient verbalizes satisfaction with results. Will monitor for remainder of inpatient status. Rabab Currington [place], [place] 11/24/2012 10:40 AM

## 2012-12-07 NOTE — Discharge Summary (Signed)
Physician Discharge Summary   Patient ID: [name] MRN: [id number] DOB/AGE: Dec 25, 1929 79 y.o.  Admit date: 11/23/2012 Discharge date: 11/24/2012  Primary Diagnosis:  Intractable bursitis,Right hip with gluteal  tendon tear.   Admission Diagnoses:  Past Medical History  Diagnosis Date  . HOH (hard of hearing)     severe hearing loss- Wears Bilateral hearing aids  . Complication of anesthesia     Blood pressure dropped after hammertoe surgery; difficulty waking up  . Hypertension   . Pneumonia     hx of  . Bronchitis     hx of  . COPD (chronic obstructive pulmonary disease)   . Shortness of breath     With ambulation at times  . Asthma   . Vertigo     hx of  . Syncope 2009    Has not had another episode since 2009  . Urination frequency     excessive at bedtime  . GERD (gastroesophageal reflux disease)   . Arthritis   . Anemia     hx of. None since hysterectomy  . Hypercholesteremia   . Restless leg syndrome   . Coronary artery disease     prox LAD stent '06   Discharge Diagnoses:   Principal Problem:   Bursitis of left hip  Estimated body mass index is 30.61 kg/(m^2) as calculated from the following:   Height as of this encounter: 5' (1.524 m).   Weight as of this encounter: 71.1 kg (156 lb 12 oz).  Procedure(s) (LRB): RIGHT HIP BURSECTOMY AND TENDON REPAIR (Right)   Consults: None  HPI: [name] is an 79 y.o. female with  significant intractable Right lateral hip pain. It has been going on for  a long time now. It is getting progressively worse over time. It  had a partial temporary response to a trochanteric injection. MRI showed bursitis and  probable gluteal tendon tear. She presents now for Right hip bursectomy and  gluteal tendon repair.   Laboratory Data: Hospital Outpatient Visit on 11/15/2012  Component Date Value Range Status  . Sodium 11/15/2012 142  135 - 145 mEq/L Final  . Potassium 11/15/2012 3.9  3.5 - 5.1 mEq/L Final  .  Chloride 11/15/2012 100  96 - 112 mEq/L Final  . CO2 11/15/2012 34* 19 - 32 mEq/L Final  . Glucose, Bld 11/15/2012 96  70 - 99 mg/dL Final  . BUN 64/40/3474 17  6 - 23 mg/dL Final  . Creatinine, Ser 11/15/2012 1.03  0.50 - 1.10 mg/dL Final  . Calcium 25/95/6387 9.7  8.4 - 10.5 mg/dL Final  . GFR calc non Af Amer 11/15/2012 50* >90 mL/min Final  . GFR calc Af Amer 11/15/2012 58* >90 mL/min Final   Comment:                                 The eGFR has been calculated                          using the CKD EPI equation.                          This calculation has not been                          validated in all clinical  situations.                          eGFR's persistently                          <90 mL/min signify                          possible Chronic Kidney Disease.  . WBC 11/15/2012 5.9  4.0 - 10.5 K/uL Final  . RBC 11/15/2012 4.52  3.87 - 5.11 MIL/uL Final  . Hemoglobin 11/15/2012 12.5  12.0 - 15.0 g/dL Final  . HCT 60/45/4098 38.2  36.0 - 46.0 % Final  . MCV 11/15/2012 84.5  78.0 - 100.0 fL Final  . MCH 11/15/2012 27.7  26.0 - 34.0 pg Final  . MCHC 11/15/2012 32.7  30.0 - 36.0 g/dL Final  . RDW 11/91/4782 12.9  11.5 - 15.5 % Final  . Platelets 11/15/2012 217  150 - 400 K/uL Final  . MRSA, PCR 11/15/2012 NEGATIVE  NEGATIVE Final  . Staphylococcus aureus 11/15/2012 NEGATIVE  NEGATIVE Final   Comment:                                 The Xpert SA Assay (FDA                          approved for NASAL specimens                          in patients over 61 years of age),                          is one component of                          a comprehensive surveillance                          program.  Test performance has                          been validated by [employer] for patients greater                          than or equal to 24 year old.                          It is not intended                           to diagnose infection nor to                          guide or monitor treatment.  . Transfuse no blood products 11/15/2012 TRANSFUSE NO BLOOD PRODUCTS, VERIFIED BY [facility] HENDRICK,RN   Final     X-Rays:No results found.  EKG: Orders placed during the  hospital encounter of 04/06/12  . EKG     Hospital Course: Patient was admitted to [facility] and taken to the OR and underwent the above state procedure without complications.  Patient tolerated the procedure well and was later transferred to the recovery room and then to the orthopaedic floor for postoperative care.  They were given PO and IV analgesics for pain control following their surgery.  They were given 24 hours of postoperative antibiotics of  Anti-infectives   Start     Dose/Rate Route Frequency Ordered Stop   11/23/12 2300  ceFAZolin (ANCEF) IVPB 1 g/50 mL premix     1 g 100 mL/hr over 30 Minutes Intravenous Every 6 hours 11/23/12 2001 11/24/12 1048   11/23/12 0930  ceFAZolin (ANCEF) IVPB 2 g/50 mL premix     2 g 100 mL/hr over 30 Minutes Intravenous On call to O.R. 11/23/12 0920 11/23/12 1635     The patient was allowed to be WBAT. Discharge planning was consulted to help with postop disposition and equipment needs.  Patient had a good night on the evening of surgery.  They started to get up OOB on day one. Patient was seen in rounds and was ready to go home later that afternoon.   Discharge Medications: Prior to Admission medications   Medication Sig Start Date End Date Taking? Authorizing Provider  albuterol-ipratropium (COMBIVENT) 18-103 MCG/ACT inhaler Inhale 2 puffs into the lungs 3 (three) times daily as needed for shortness of breath.    Yes Historical Provider, MD  atorvastatin (LIPITOR) 10 MG tablet Take 10 mg by mouth at bedtime.    Yes Historical Provider, MD  esomeprazole (NEXIUM) 40 MG capsule Take 40 mg by mouth 2 (two) times daily.    Yes Historical Provider, MD  fesoterodine (TOVIAZ) 8 MG TB24  Take 8 mg by mouth daily.   Yes Historical Provider, MD  mirtazapine (REMERON) 15 MG tablet Take 15 mg by mouth at bedtime.   Yes Historical Provider, MD  montelukast (SINGULAIR) 10 MG tablet Take 10 mg by mouth at bedtime.   Yes Historical Provider, MD  pilocarpine (SALAGEN) 5 MG tablet Take 5 mg by mouth 3 (three) times daily.   Yes Historical Provider, MD  potassium chloride SA (K-DUR,KLOR-CON) 20 MEQ tablet Take 20 mEq by mouth 2 (two) times daily.   Yes Historical Provider, MD  rOPINIRole (REQUIP) 1 MG tablet Take 1 mg by mouth at bedtime.    Yes Historical Provider, MD  torsemide (DEMADEX) 20 MG tablet Take 20 mg by mouth 2 (two) times daily.   Yes Historical Provider, MD  zolpidem (AMBIEN) 10 MG tablet Take 10 mg by mouth at bedtime as needed for sleep.    Yes Historical Provider, MD  fish oil-omega-3 fatty acids 1000 MG capsule Take 1 g by mouth 2 (two) times daily.    Historical Provider, MD  methocarbamol (ROBAXIN) 500 MG tablet Take 1 tablet (500 mg total) by mouth every 6 (six) hours as needed. 11/24/12   Ranisha Allaire, PA-C  oxyCODONE (OXY IR/ROXICODONE) 5 MG immediate release tablet Take 1 tablet (5 mg total) by mouth every 6 (six) hours as needed for pain. 11/24/12   Tadeusz Stahl, PA-C  polyethylene glycol (MIRALAX / GLYCOLAX) packet Take 17 g by mouth daily as needed (constipation).    Historical Provider, MD    Diet: Cardiac diet Activity:WBAT Follow-up:in 2 weeks Disposition - Home Discharged Condition: good   Discharge Orders   Future Orders Complete By Expires  Call MD / Call 911  As directed     Comments:      If you experience chest pain or shortness of breath, CALL 911 and be transported to the hospital emergency room.  If you develope a fever above 101 F, pus (white drainage) or increased drainage or redness at the wound, or calf pain, call your surgeon's office.    Constipation Prevention  As directed     Comments:      Drink plenty of fluids.  Prune  juice may be helpful.  You may use a stool softener, such as Colace (over the counter) 100 mg twice a day.  Use MiraLax (over the counter) for constipation as needed.    Diet - low sodium heart healthy  As directed     Discharge instructions  As directed     Comments:      Pick up stool softner and laxative for home. Do not submerge incision under water. May shower starting Saturday. Start changing dressing tomorrow, Friday 11/25/2012 Continue to use ice for pain and swelling from surgery.  Take an Enteric Coated 325 mg Aspirin daily for four weeks.    Do not sit on low chairs, stoools or toilet seats, as it may be difficult to get up from low surfaces  As directed     Driving restrictions  As directed     Comments:      No driving until released by the physician.    Increase activity slowly as tolerated  As directed     Lifting restrictions  As directed     Comments:      No lifting until released by the physician.    Patient may shower  As directed     Comments:      You may shower without a dressing staring Saturday.  Do not wash over the wound.  If drainage remains, do not shower until drainage stops. Reapply dry dressing after shower.    TED hose  As directed     Comments:      Use stockings (TED hose) for 3 weeks on both leg(s).  You may remove them at night for sleeping.    Weight bearing as tolerated  As directed         Medication List    TAKE these medications       albuterol-ipratropium 18-103 MCG/ACT inhaler  Commonly known as:  COMBIVENT  Inhale 2 puffs into the lungs 3 (three) times daily as needed for shortness of breath.     atorvastatin 10 MG tablet  Commonly known as:  LIPITOR  Take 10 mg by mouth at bedtime.     esomeprazole 40 MG capsule  Commonly known as:  NEXIUM  Take 40 mg by mouth 2 (two) times daily.     fish oil-omega-3 fatty acids 1000 MG capsule  Take 1 g by mouth 2 (two) times daily.     methocarbamol 500 MG tablet  Commonly known as:   ROBAXIN  Take 1 tablet (500 mg total) by mouth every 6 (six) hours as needed.     mirtazapine 15 MG tablet  Commonly known as:  REMERON  Take 15 mg by mouth at bedtime.     montelukast 10 MG tablet  Commonly known as:  SINGULAIR  Take 10 mg by mouth at bedtime.     oxyCODONE 5 MG immediate release tablet  Commonly known as:  Oxy IR/ROXICODONE  Take 1 tablet (5 mg total) by mouth  every 6 (six) hours as needed for pain.     pilocarpine 5 MG tablet  Commonly known as:  SALAGEN  Take 5 mg by mouth 3 (three) times daily.     polyethylene glycol packet  Commonly known as:  MIRALAX / GLYCOLAX  Take 17 g by mouth daily as needed (constipation).     potassium chloride SA 20 MEQ tablet  Commonly known as:  K-DUR,KLOR-CON  Take 20 mEq by mouth 2 (two) times daily.     rOPINIRole 1 MG tablet  Commonly known as:  REQUIP  Take 1 mg by mouth at bedtime.     torsemide 20 MG tablet  Commonly known as:  DEMADEX  Take 20 mg by mouth 2 (two) times daily.     TOVIAZ 8 MG Tb24  Generic drug:  fesoterodine  Take 8 mg by mouth daily.     zolpidem 10 MG tablet  Commonly known as:  AMBIEN  Take 10 mg by mouth at bedtime as needed for sleep.         SignedPatrica Duel 12/07/2012, 10:52 PM

## 2013-04-19 DIAGNOSIS — M1990 Unspecified osteoarthritis, unspecified site: Secondary | ICD-10-CM | POA: Insufficient documentation

## 2013-04-19 DIAGNOSIS — I10 Essential (primary) hypertension: Secondary | ICD-10-CM | POA: Insufficient documentation

## 2013-04-20 DIAGNOSIS — I259 Chronic ischemic heart disease, unspecified: Secondary | ICD-10-CM | POA: Insufficient documentation

## 2013-04-20 DIAGNOSIS — M549 Dorsalgia, unspecified: Secondary | ICD-10-CM | POA: Insufficient documentation

## 2013-04-20 DIAGNOSIS — E785 Hyperlipidemia, unspecified: Secondary | ICD-10-CM | POA: Insufficient documentation

## 2013-04-20 DIAGNOSIS — M48061 Spinal stenosis, lumbar region without neurogenic claudication: Secondary | ICD-10-CM | POA: Insufficient documentation

## 2013-04-20 DIAGNOSIS — I517 Cardiomegaly: Secondary | ICD-10-CM | POA: Insufficient documentation

## 2013-04-20 DIAGNOSIS — I079 Rheumatic tricuspid valve disease, unspecified: Secondary | ICD-10-CM | POA: Insufficient documentation

## 2014-03-08 DIAGNOSIS — M25579 Pain in unspecified ankle and joints of unspecified foot: Secondary | ICD-10-CM | POA: Insufficient documentation

## 2014-05-17 IMAGING — CR DG CHEST 2V
2 series · 2 of 2 positions shown · non-contrast
Comparison: 06/25/2011.

CLINICAL DATA: Preop.

CHEST - 2 VIEW

[view not recorded (1 of 2)]
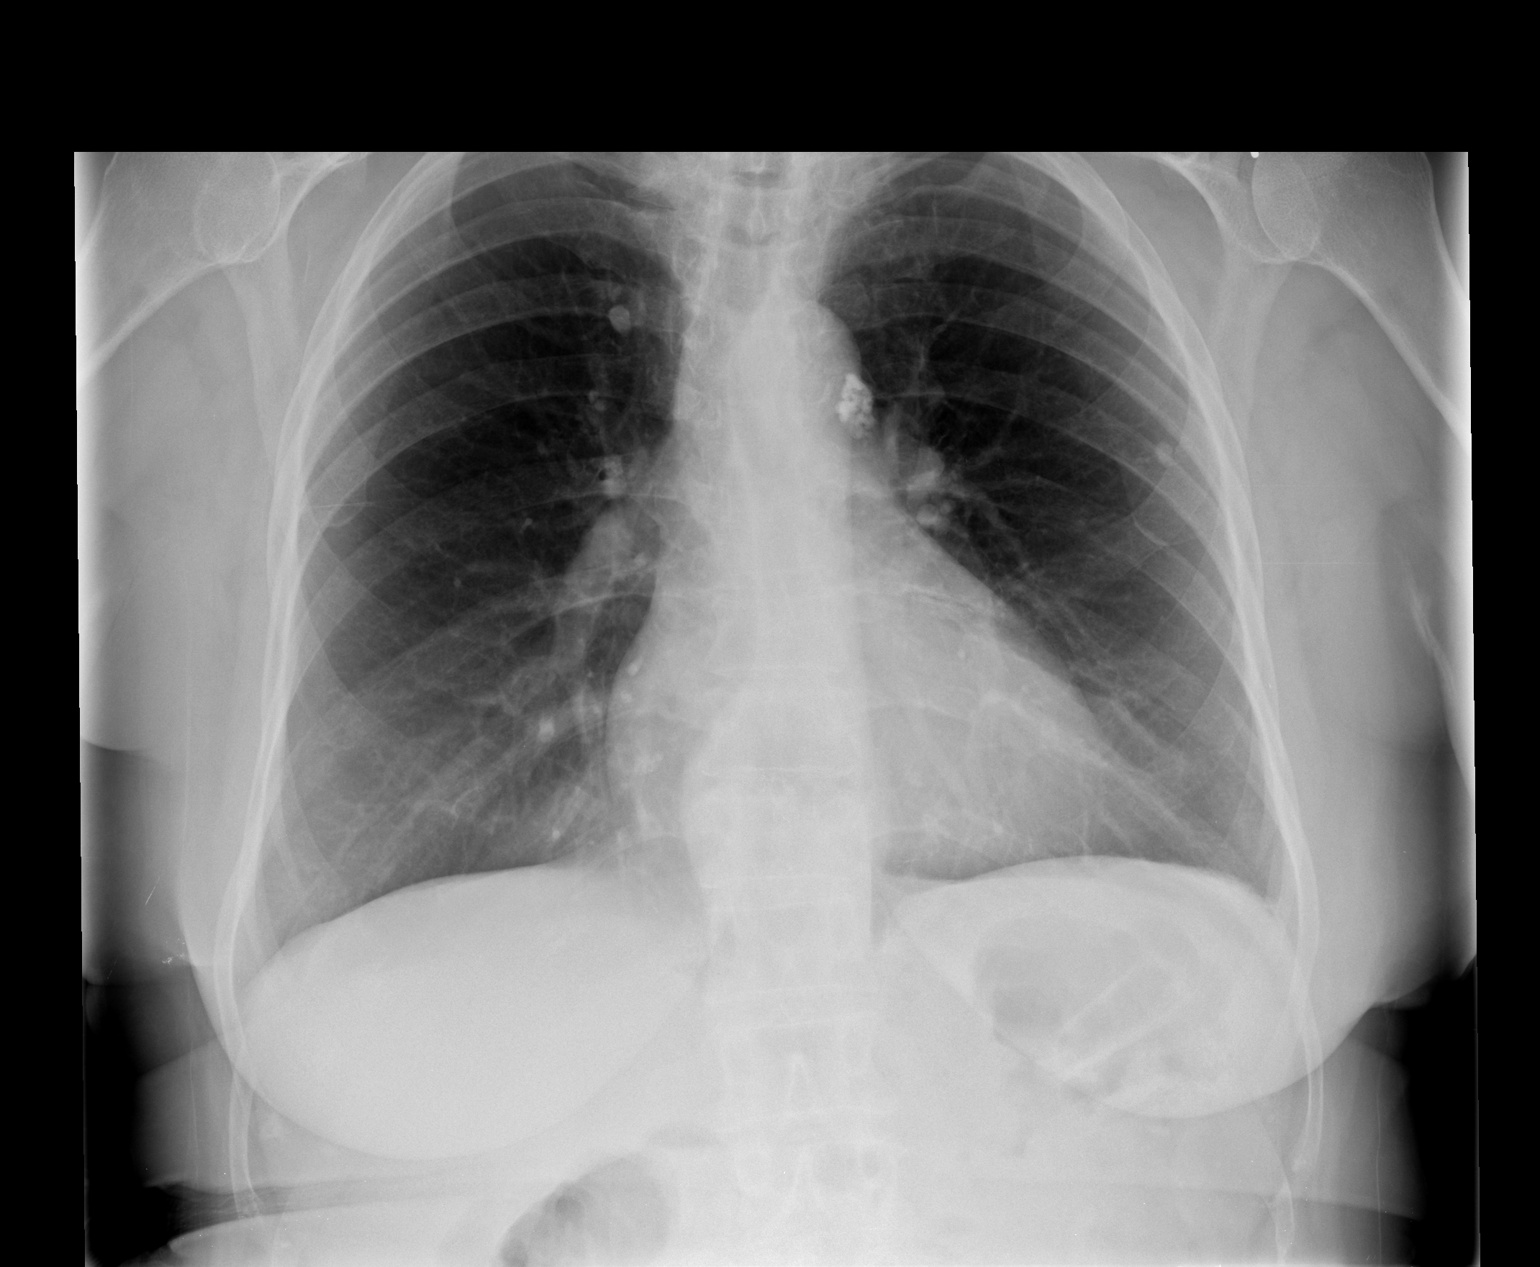

[view not recorded (2 of 2)]
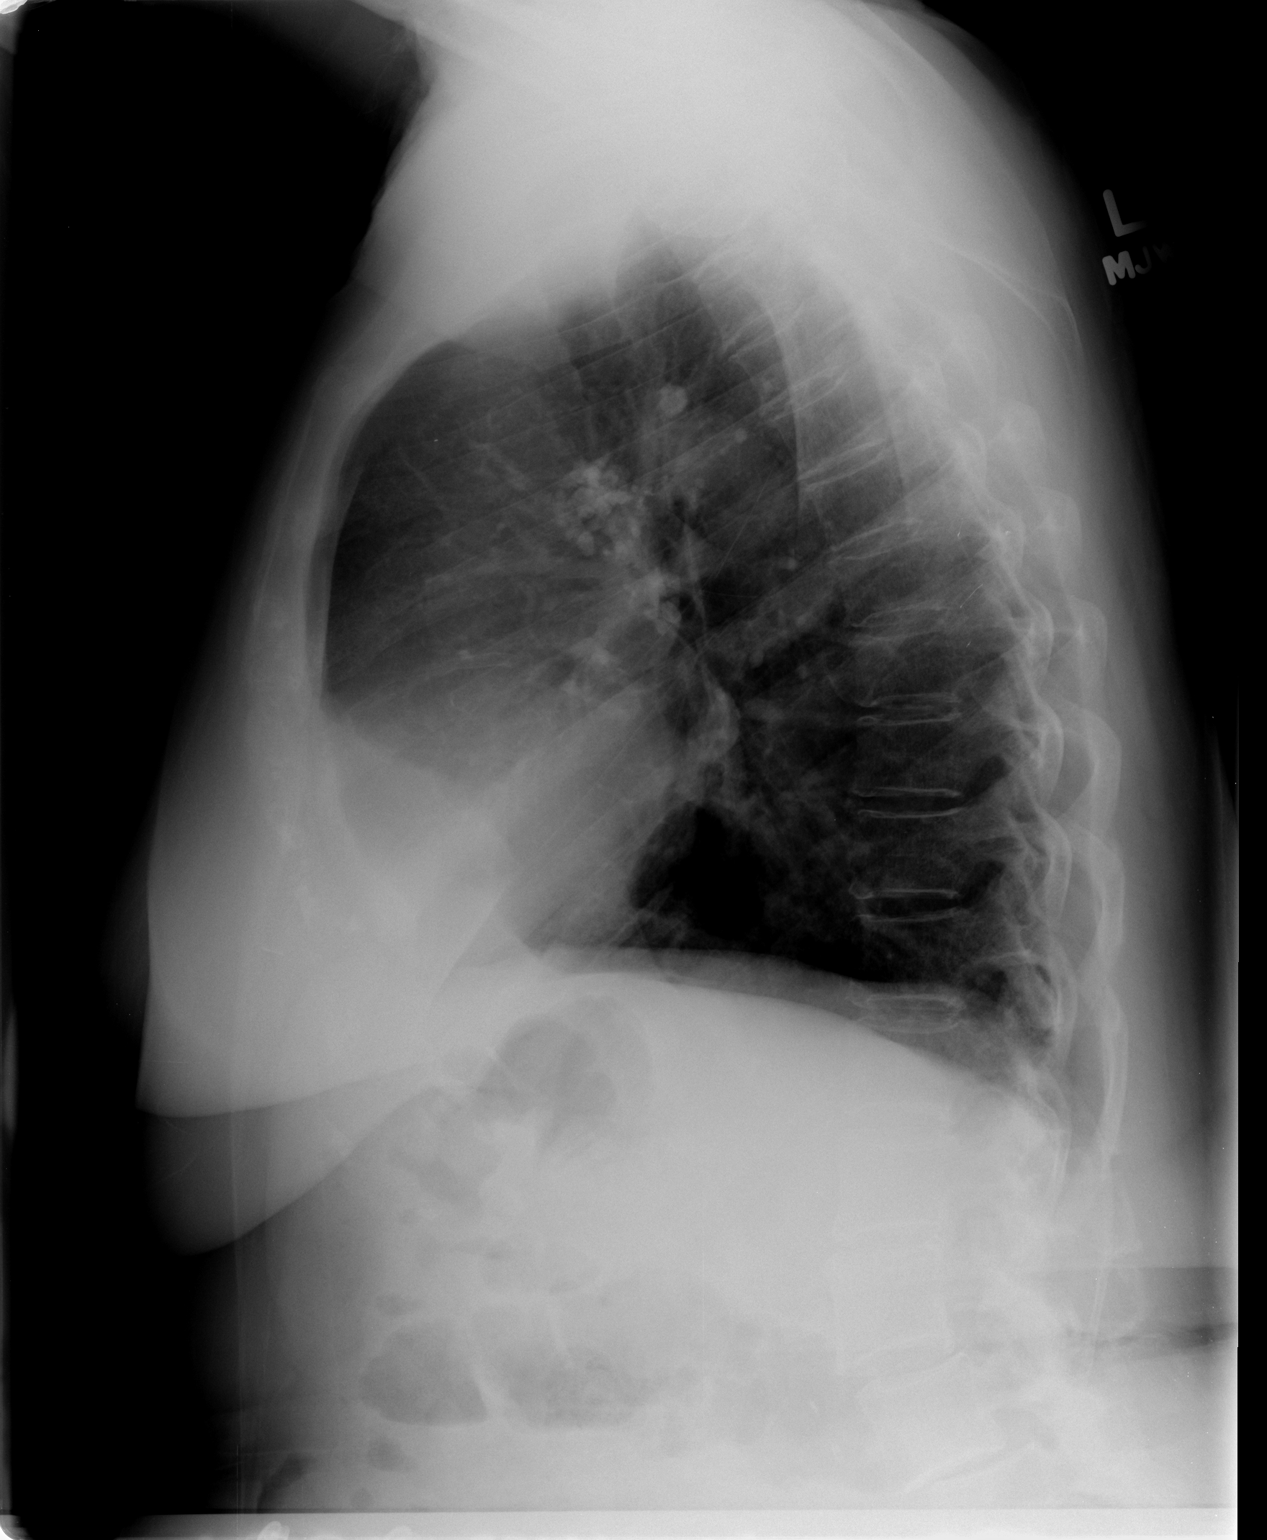

[2 of 2 positions shown; findings below may reference images not displayed]

FINDINGS: Trachea is midline.  Heart size normal.  There are
calcified mediastinal lymph nodes.  Biapical pleural parenchymal
scarring.  Scattered calcified granulomas in the lungs.  Minimal
scarring at the lung bases.  No pleural fluid.  Moderate hiatal
hernia.
IMPRESSION: 1.  No acute findings.
2.  Moderate hiatal hernia.

## 2014-05-26 IMAGING — RF DG LUMBAR SPINE COMPLETE 4+V
1 series · 4 of 4 positions shown · non-contrast
Comparison: 03/28/2012

CLINICAL DATA: Back pain

DG C-ARM GT 120 MIN,LUMBAR SPINE - COMPLETE 4+ VIEW

[Series 1: run · 4 of 4 slices shown]
[im 1/4]
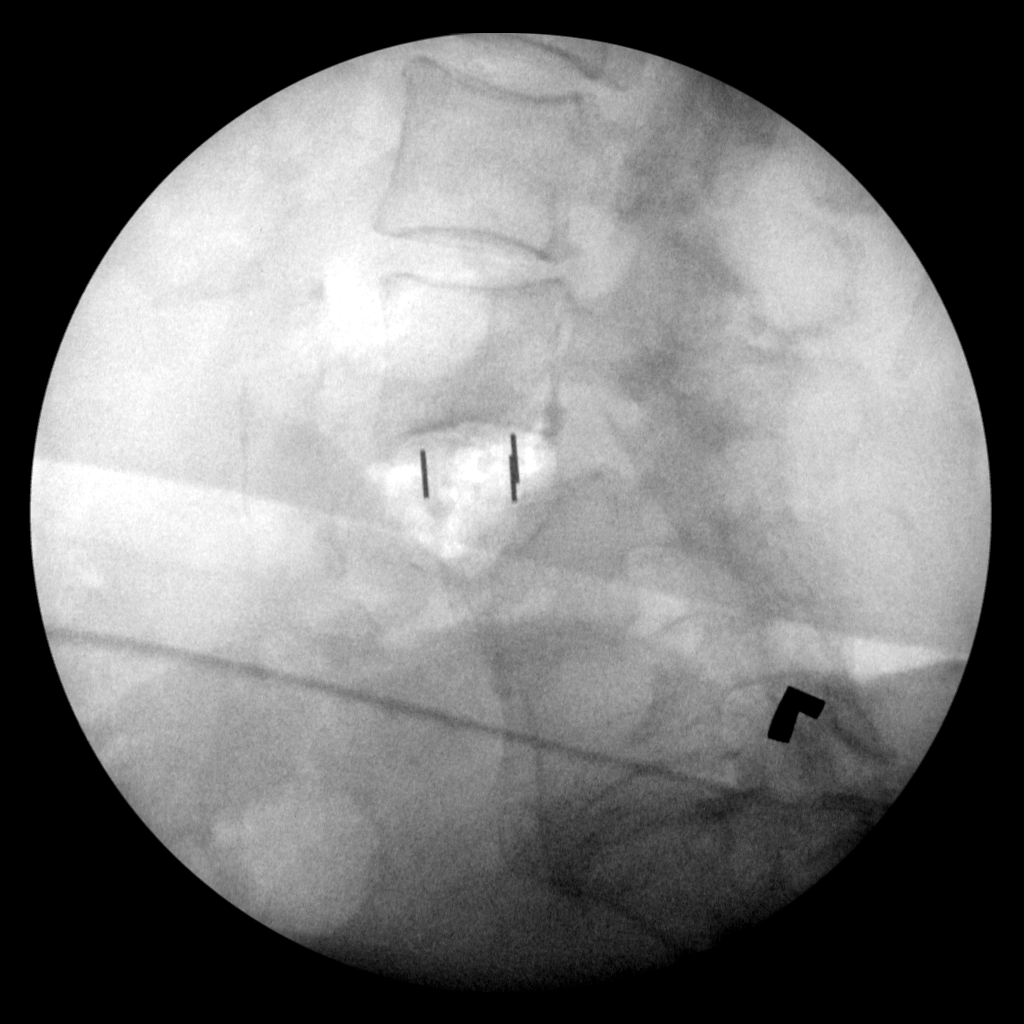
[im 2/4]
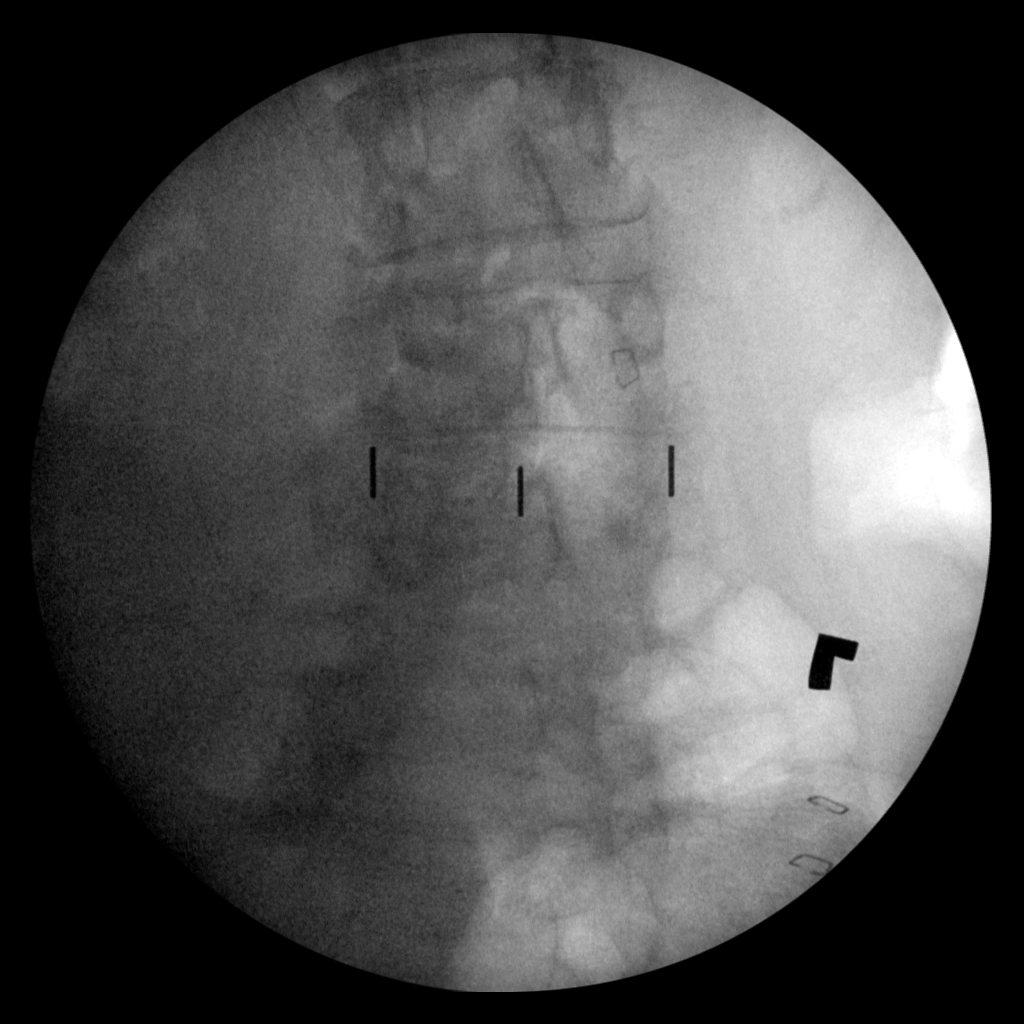
[im 3/4]
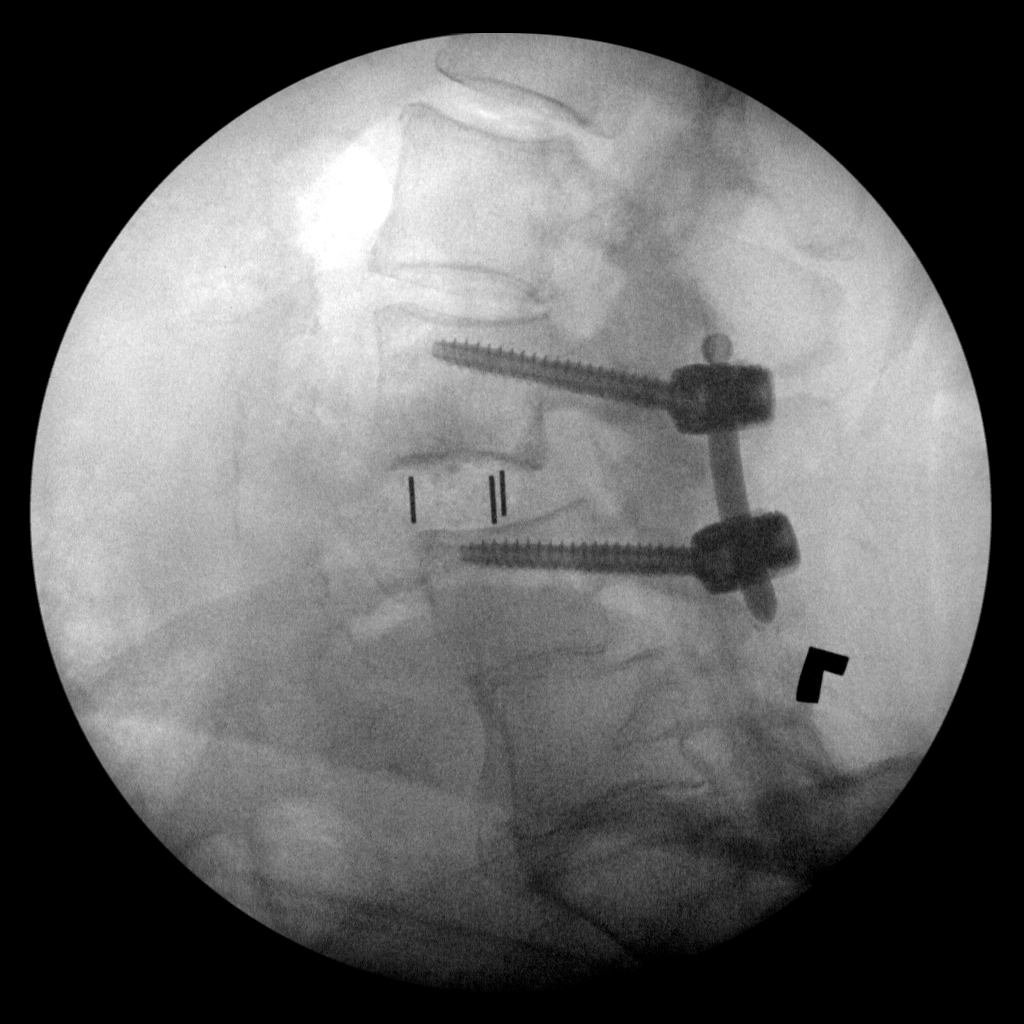
[im 4/4]
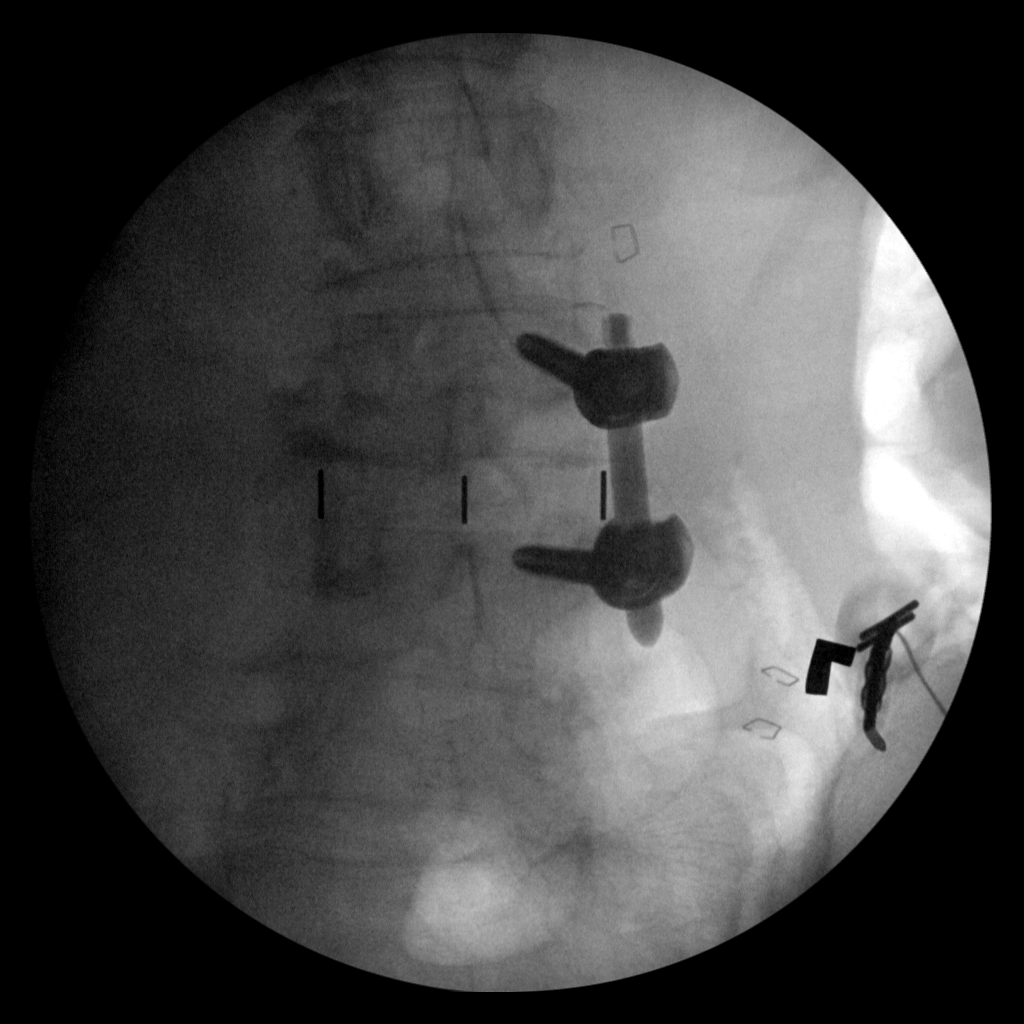

[4 of 4 positions shown; findings below may reference images not displayed]

FINDINGS: C-arm films document L3-4 XLIF with unilateral left-sided
L3 and L4 pedicle screw construct.  Moderate anterolisthesis L3 on
L4 persists.
IMPRESSION: As above.

## 2014-09-06 DIAGNOSIS — Z9181 History of falling: Secondary | ICD-10-CM | POA: Insufficient documentation

## 2015-04-12 DIAGNOSIS — R55 Syncope and collapse: Secondary | ICD-10-CM | POA: Insufficient documentation

## 2015-04-12 DIAGNOSIS — R5383 Other fatigue: Secondary | ICD-10-CM | POA: Insufficient documentation

## 2015-04-12 DIAGNOSIS — K1379 Other lesions of oral mucosa: Secondary | ICD-10-CM | POA: Insufficient documentation

## 2015-04-16 DIAGNOSIS — R21 Rash and other nonspecific skin eruption: Secondary | ICD-10-CM | POA: Insufficient documentation

## 2015-08-07 DIAGNOSIS — K219 Gastro-esophageal reflux disease without esophagitis: Secondary | ICD-10-CM | POA: Insufficient documentation

## 2015-08-07 DIAGNOSIS — N3281 Overactive bladder: Secondary | ICD-10-CM | POA: Insufficient documentation

## 2015-10-15 DIAGNOSIS — I779 Disorder of arteries and arterioles, unspecified: Secondary | ICD-10-CM | POA: Insufficient documentation

## 2015-10-28 ENCOUNTER — Other Ambulatory Visit: Payer: Self-pay | Admitting: *Deleted

## 2015-10-28 DIAGNOSIS — I6522 Occlusion and stenosis of left carotid artery: Secondary | ICD-10-CM

## 2015-11-08 ENCOUNTER — Encounter: Payer: Self-pay | Admitting: Vascular Surgery

## 2015-11-15 ENCOUNTER — Encounter: Payer: Self-pay | Admitting: Vascular Surgery

## 2015-11-15 ENCOUNTER — Encounter: Payer: Medicare Other | Admitting: Vascular Surgery

## 2015-11-15 ENCOUNTER — Ambulatory Visit (INDEPENDENT_AMBULATORY_CARE_PROVIDER_SITE_OTHER): Payer: Medicare Other | Admitting: Vascular Surgery

## 2015-11-15 ENCOUNTER — Ambulatory Visit (HOSPITAL_COMMUNITY)
Admission: RE | Admit: 2015-11-15 | Discharge: 2015-11-15 | Disposition: A | Discharge status: Home / Self Care | Payer: Medicare Other | Source: Ambulatory Visit | Attending: Vascular Surgery | Admitting: Vascular Surgery

## 2015-11-15 VITALS — BP 153/69 | HR 60 | Ht 60.0 in | Wt 150.2 lb

## 2015-11-15 DIAGNOSIS — I739 Peripheral vascular disease, unspecified: Secondary | ICD-10-CM | POA: Diagnosis not present

## 2015-11-15 DIAGNOSIS — E7800 Pure hypercholesterolemia, unspecified: Secondary | ICD-10-CM | POA: Insufficient documentation

## 2015-11-15 DIAGNOSIS — I6523 Occlusion and stenosis of bilateral carotid arteries: Secondary | ICD-10-CM

## 2015-11-15 DIAGNOSIS — I6522 Occlusion and stenosis of left carotid artery: Secondary | ICD-10-CM | POA: Diagnosis present

## 2015-11-15 DIAGNOSIS — I119 Hypertensive heart disease without heart failure: Secondary | ICD-10-CM | POA: Insufficient documentation

## 2015-11-15 DIAGNOSIS — I2510 Atherosclerotic heart disease of native coronary artery without angina pectoris: Secondary | ICD-10-CM | POA: Insufficient documentation

## 2015-11-15 DIAGNOSIS — K219 Gastro-esophageal reflux disease without esophagitis: Secondary | ICD-10-CM | POA: Diagnosis not present

## 2015-11-15 NOTE — Progress Notes (Signed)
Referring Physician: No ref. provider found  Patient name: [name] MRN: [id number] DOB: 1930-01-24 Sex: female  REASON FOR CONSULT: Asymptomatic high-grade left internal carotid artery stenosis  HPI: [name] is a 82 y.o. female with a long history of bilateral carotid occlusive disease. This is been followed in the past with serial ultrasound. She was referred for progression of carotid disease on the left side to greater than 80%. She denies any history of TIA amaurosis or stroke. She does have a history of coronary artery disease and had a left anterior descending stent placed several years ago. She also recently had a workup for syncope at [facility] earlier this year. By history this sounds like she had an echocardiogram and one week of cardiac monitoring. She states that the workup was negative. She denies having a stress test in the last 10 years. She does describe some tightness and heaviness in her chest randomly on occasion at rest or with effort. She also does complain of some occasional pulsatile tinnitus. Her office notes and prior history suggested prior right carotid stenting; however, this was actually coronary artery stenting of her left anterior descending and she has had no prior carotid interventions. Other medical problems include hypertension and COPD. She is on 2 inhalers but rarely uses disease. She does take aspirin and Lipitor. Chronic medical problems include hypertension which has been stable. 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  Past Medical History  Diagnosis Date  . HOH (hard of hearing)     severe hearing loss- Wears Bilateral hearing aids  . Complication of anesthesia     Blood pressure dropped after hammertoe surgery; difficulty waking up  . Hypertension   . Pneumonia     hx of  . Bronchitis     hx of  . COPD (chronic obstructive pulmonary disease) ([place])   . Shortness of  breath     With ambulation at times  . Asthma   . Vertigo     hx of  . Syncope 2009    Has not had another episode since 2009  . Urination frequency     excessive at bedtime  . GERD (gastroesophageal reflux disease)   . Arthritis   . Anemia     hx of. None since hysterectomy  . Hypercholesteremia   . Restless leg syndrome   . Coronary artery disease     prox LAD stent '06  . CAD (coronary artery disease)   . Peripheral vascular disease ([place])   . Carotid artery occlusion   . Enlarged heart    Past Surgical History  Procedure Laterality Date  . Abdominal hysterectomy    . Tonsillectomy    . Cholecystectomy    . Appendectomy    . Back surgery    . Joint replacement  05/2011    Left knee replacement  . Colonoscopy    . Inner ear surgery    . Eye surgery  2012    Bilateral cataract removal  . Foot surgery      Right foot hammer toe surgery  . Anterior lat lumbar fusion  04/06/2012    Procedure: ANTERIOR LATERAL LUMBAR FUSION 1 LEVEL;  Surgeon: Melina Schools, MD;  Location: [facility];  Service: Orthopedics;  Laterality: N/A;  XLIF L3-L4 with Posterior Instrumentation   . Cardiac catheterization    . Coronary angioplasty  2006    Prox LAD  . Excision/release  bursa hip Right 11/23/2012    Procedure: RIGHT HIP BURSECTOMY AND TENDON REPAIR;  Surgeon: Gearlean Alf, MD;  Location: [facility];  Service: Orthopedics;  Laterality: Right;     Family History  Problem Relation Age of Onset  . Heart disease Sister     before age 54  . Heart disease Son     before age 41    SOCIAL HISTORY: Social History   Social History  . Marital Status: Widowed    Spouse Name: N/A  . Number of Children: N/A  . Years of Education: N/A   Occupational History  . Not on file.   Social History Main Topics  . Smoking status: Former Smoker    Types: Cigarettes    Quit date: 11/15/1964  . Smokeless tobacco: Not on file  . Alcohol Use: No  . Drug Use: No  . Sexual Activity: Not Currently    Other Topics Concern  . 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 Norco [Hydrocodone-Acetaminophen] Other (See Comments)    Patient states allergy to Hydrocodone. "Jittery, doesn't work."  . Pineapple Other (See Comments)    Mouth stings and burns  . Sulfa Antibiotics Swelling  . Latex Rash    Current Outpatient Prescriptions  Medication Sig Dispense Refill  . amLODipine (NORVASC) 5 MG tablet Take 5 mg by mouth.    Marland Kitchen aspirin EC 81 MG tablet Take 81 mg by mouth.    Marland Kitchen atorvastatin (LIPITOR) 10 MG tablet Take 10 mg by mouth at bedtime.     Marland Kitchen denosumab (PROLIA) 60 MG/ML SOLN injection Inject 60 mg into the skin.    Marland Kitchen esomeprazole (NEXIUM) 40 MG capsule Take 40 mg by mouth 2 (two) times daily.     . fesoterodine (TOVIAZ) 8 MG TB24 Take 8 mg by mouth daily.    . fluticasone (FLONASE) 50 MCG/ACT nasal spray     . mirtazapine (REMERON) 15 MG tablet Take 15 mg by mouth at bedtime.    . montelukast (SINGULAIR) 10 MG tablet Take 10 mg by mouth at bedtime.    . nitroGLYCERIN (NITROSTAT) 0.4 MG SL tablet Place 0.4 mg under the tongue.    Marland Kitchen Oxycodone HCl 10 MG TABS     . pilocarpine (SALAGEN) 5 MG tablet Take 5 mg by mouth 3 (three) times daily.    . potassium chloride SA (K-DUR,KLOR-CON) 20 MEQ tablet Take 20 mEq by mouth 2 (two) times daily.    Marland Kitchen rOPINIRole (REQUIP) 1 MG tablet Take 1 mg by mouth at bedtime.     . torsemide (DEMADEX) 20 MG tablet Take 20 mg by mouth 2 (two) times daily.    . TRIAMCINOLONE ACETONIDE, TOP, 0.05 % OINT Apply topically.    . Vitamin D, Ergocalciferol, (DRISDOL) 50000 units CAPS capsule Take by mouth.    . zolpidem (AMBIEN) 10 MG tablet Take 10 mg by mouth at bedtime as needed for sleep.     Marland Kitchen albuterol-ipratropium (COMBIVENT) 18-103 MCG/ACT inhaler Inhale 2 puffs into the lungs 3 (three) times daily as needed for shortness of breath. Reported on 11/15/2015     . albuterol-ipratropium (COMBIVENT) 18-103 MCG/ACT inhaler Inhale into the lungs. Reported on 11/15/2015    . fish oil-omega-3 fatty acids 1000 MG capsule Take 1 g by mouth 2 (two) times daily. Reported on 11/15/2015    . methocarbamol (ROBAXIN) 500 MG  tablet Take 1 tablet (500 mg total) by mouth every 6 (six) hours as needed. (Patient not taking: Reported on 11/15/2015) 80 tablet 0  . oxyCODONE (OXY IR/ROXICODONE) 5 MG immediate release tablet Take 1 tablet (5 mg total) by mouth every 6 (six) hours as needed for pain. (Patient not taking: Reported on 11/15/2015) 80 tablet 0  . polyethylene glycol (MIRALAX / GLYCOLAX) packet Take 17 g by mouth daily as needed (constipation). Reported on 11/15/2015     No current facility-administered medications for this visit.    ROS:   General:  No weight loss, Fever, chills  HEENT: No recent headaches, no nasal bleeding, no visual changes, no sore throat  Neurologic: No dizziness, blackouts, seizures. No recent symptoms of stroke or mini- stroke. No recent episodes of slurred speech, or temporary blindness.  Cardiac: No recent episodes of chest pain/pressure, no shortness of breath at rest.  No shortness of breath with exertion.  Denies history of atrial fibrillation or irregular heartbeat  Vascular: No history of rest pain in feet.  No history of claudication.  No history of non-healing ulcer, No history of DVT   Pulmonary: No home oxygen, no productive cough, no hemoptysis,  No asthma or wheezing  Musculoskeletal:  [ ]  Arthritis, [ ]  Low back pain,  [ ]  Joint pain  Hematologic:No history of hypercoagulable state.  No history of easy bleeding.  No history of anemia  Gastrointestinal: No hematochezia or melena,  No gastroesophageal reflux, no trouble swallowing  Urinary: [ ]  chronic Kidney disease, [ ]  on HD - [ ]  MWF or [ ]  TTHS, [ ]  Burning with urination, [ ]  Frequent urination, [ ]  Difficulty urinating;   Skin: No rashes  Psychological: No history  of anxiety,  No history of depression   Physical Examination  Filed Vitals:   11/15/15 0925 11/15/15 0927  BP: 152/68 153/69  Pulse: 60   Height: 5' (1.524 m)   Weight: 150 lb 3.2 oz (68.13 kg)   SpO2: 97%     Body mass index is 29.33 kg/(m^2).  General:  Alert and oriented, no acute distress HEENT: Normal Neck: Bilateral carotid bruit Pulmonary: Clear to auscultation bilaterally Cardiac: Regular Rate and Rhythm without 2/6 systolic murmur heard best right second interspace Abdomen: Soft, non-tender, non-distended, no mass Skin: No rash Extremity Pulses:  2+ radial, brachial, femoral, absent dorsalis pedis, 2+ right 1+ left posterior tibial pulses Musculoskeletal: No deformity or edema  Neurologic: Upper and lower extremity motor 5/5 and symmetric  DATA: Patient had a carotid duplex scan today which shows greater than 80% stenosis on the left side with velocities of 393/123. She had a 40-60% right internal carotid artery stenosis. Vertebral arteries were antegrade.  ASSESSMENT:  82 year old female with asymptomatic left internal carotid artery stenosis. She currently is on maximal medical management with aspirin and a statin. I discussed with her carotid endarterectomy for stroke prophylaxis. I also discussed with her the risk of stroke overall is low about 5% per year. I discussed with her that stroke risk with carotid endarterectomy is about 1-2% perioperative period also other risks of cardiac events 5% cranial nerve injury risk 10-15%. Also with a symptomatically carotid endarterectomy to achieve full benefit she would need to live at least 5 years. 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 Overall risk of transfusion for carotid endarterectomy is low. However we will make sure we check a baseline hemoglobin to make sure that she would not be  at risk for requiring transfusion. Since she is greater than 35 years old we will make sure that all of the  medical systems are completely optimized including cardiac risk stratification. If she is deemed to be low risk overall we will proceed with left carotid endarterectomy. If she is moderate or high risk we will continue medical management.  PLAN:  See above   Ruta Hinds, MD [facility] Office: [phone] Pager: [phone]

## 2015-11-29 ENCOUNTER — Other Ambulatory Visit: Payer: Self-pay

## 2015-12-04 ENCOUNTER — Other Ambulatory Visit (HOSPITAL_COMMUNITY): Payer: Self-pay | Admitting: *Deleted

## 2015-12-04 NOTE — Pre-Procedure Instructions (Signed)
    YUBIA SCALF  12/04/2015      [place], [place] [address] Phone: [phone] Fax: [phone]  [facility] [address], [place] [address] Phone: [phone] Fax: [phone]    Your procedure is scheduled on Wednesday, Dec 11, 2015 at 8:30 AM.  Report to [facility] Entrance "A" Admitting Office at 6:30 AM.  Call this number if you have problems the morning of surgery: [phone]  Any questions prior to day of surgery, please call [phone] between 8 & 4 PM.   Remember:  Do not eat food or drink liquids after midnight Tuesday, 12/10/15.  Take these medicines the morning of surgery with A SIP OF WATER: Amlodipine (Norvasc), Aspirin, Esomeprazole (Nexium), Fesoterodine (Toviaz), Flonase nasal spray, Oxycodone - if needed   Do not wear jewelry, make-up or nail polish.  Do not wear lotions, powders, or perfumes.  You may wear deodorant.  Do not shave 48 hours prior to surgery.   Do not bring valuables to the hospital.  [facility] is not responsible for any belongings or valuables.  Contacts, dentures or bridgework may not be worn into surgery.  Leave your suitcase in the car.  After surgery it may be brought to your room.  For patients admitted to the hospital, discharge time will be determined by your treatment team.  Special instructions:  See "Preparing for Surgery" Instruction sheet.  Please read over the following fact sheets that you were given. Pain Booklet, Coughing and Deep Breathing, MRSA Information and Surgical Site Infection Prevention

## 2015-12-05 ENCOUNTER — Encounter (HOSPITAL_COMMUNITY)
Admission: RE | Admit: 2015-12-05 | Discharge: 2015-12-05 | Disposition: A | Discharge status: Home / Self Care | Payer: Medicare Other | Source: Ambulatory Visit | Attending: Vascular Surgery | Admitting: Vascular Surgery

## 2015-12-05 ENCOUNTER — Encounter (HOSPITAL_COMMUNITY): Payer: Self-pay

## 2015-12-05 DIAGNOSIS — I6522 Occlusion and stenosis of left carotid artery: Secondary | ICD-10-CM | POA: Diagnosis not present

## 2015-12-05 DIAGNOSIS — I739 Peripheral vascular disease, unspecified: Secondary | ICD-10-CM | POA: Insufficient documentation

## 2015-12-05 DIAGNOSIS — E7800 Pure hypercholesterolemia, unspecified: Secondary | ICD-10-CM | POA: Diagnosis not present

## 2015-12-05 DIAGNOSIS — K219 Gastro-esophageal reflux disease without esophagitis: Secondary | ICD-10-CM | POA: Diagnosis not present

## 2015-12-05 DIAGNOSIS — G2581 Restless legs syndrome: Secondary | ICD-10-CM | POA: Diagnosis not present

## 2015-12-05 DIAGNOSIS — J449 Chronic obstructive pulmonary disease, unspecified: Secondary | ICD-10-CM | POA: Insufficient documentation

## 2015-12-05 DIAGNOSIS — Z01812 Encounter for preprocedural laboratory examination: Secondary | ICD-10-CM | POA: Insufficient documentation

## 2015-12-05 DIAGNOSIS — Z01818 Encounter for other preprocedural examination: Secondary | ICD-10-CM | POA: Diagnosis not present

## 2015-12-05 DIAGNOSIS — Z96652 Presence of left artificial knee joint: Secondary | ICD-10-CM | POA: Insufficient documentation

## 2015-12-05 DIAGNOSIS — Z87891 Personal history of nicotine dependence: Secondary | ICD-10-CM | POA: Insufficient documentation

## 2015-12-05 DIAGNOSIS — I10 Essential (primary) hypertension: Secondary | ICD-10-CM | POA: Insufficient documentation

## 2015-12-05 DIAGNOSIS — Z981 Arthrodesis status: Secondary | ICD-10-CM | POA: Diagnosis not present

## 2015-12-05 DIAGNOSIS — Z955 Presence of coronary angioplasty implant and graft: Secondary | ICD-10-CM | POA: Diagnosis not present

## 2015-12-05 DIAGNOSIS — I2510 Atherosclerotic heart disease of native coronary artery without angina pectoris: Secondary | ICD-10-CM | POA: Diagnosis not present

## 2015-12-05 HISTORY — DX: Malignant (primary) neoplasm, unspecified: C80.1

## 2015-12-05 LAB — APTT: APTT: 37 s (ref 24–37)

## 2015-12-05 LAB — COMPREHENSIVE METABOLIC PANEL
ALBUMIN: 3.9 g/dL (ref 3.5–5.0)
ALK PHOS: 79 U/L (ref 38–126)
ALT: 24 U/L (ref 14–54)
ANION GAP: 11 (ref 5–15)
AST: 26 U/L (ref 15–41)
BILIRUBIN TOTAL: 0.4 mg/dL (ref 0.3–1.2)
BUN: 22 mg/dL — ABNORMAL HIGH (ref 6–20)
CALCIUM: 9.6 mg/dL (ref 8.9–10.3)
CO2: 32 mmol/L (ref 22–32)
Chloride: 99 mmol/L — ABNORMAL LOW (ref 101–111)
Creatinine, Ser: 1.04 mg/dL — ABNORMAL HIGH (ref 0.44–1.00)
GFR calc Af Amer: 56 mL/min — ABNORMAL LOW (ref >60)
GFR, EST NON AFRICAN AMERICAN: 48 mL/min — AB (ref >60)
GLUCOSE: 100 mg/dL — AB (ref 65–99)
Potassium: 3.6 mmol/L (ref 3.5–5.1)
Sodium: 142 mmol/L (ref 135–145)
TOTAL PROTEIN: 6.8 g/dL (ref 6.5–8.1)

## 2015-12-05 LAB — URINALYSIS, ROUTINE W REFLEX MICROSCOPIC
BILIRUBIN URINE: NEGATIVE
Glucose, UA: NEGATIVE mg/dL
HGB URINE DIPSTICK: NEGATIVE
KETONES UR: NEGATIVE mg/dL
Leukocytes, UA: NEGATIVE
Nitrite: NEGATIVE
PH: 6 (ref 5.0–8.0)
Protein, ur: NEGATIVE mg/dL
SPECIFIC GRAVITY, URINE: 1.008 (ref 1.005–1.030)

## 2015-12-05 LAB — CBC
HEMATOCRIT: 39.5 % (ref 36.0–46.0)
HEMOGLOBIN: 12.8 g/dL (ref 12.0–15.0)
MCH: 28.1 pg (ref 26.0–34.0)
MCHC: 32.4 g/dL (ref 30.0–36.0)
MCV: 86.6 fL (ref 78.0–100.0)
Platelets: 265 10*3/uL (ref 150–400)
RBC: 4.56 MIL/uL (ref 3.87–5.11)
RDW: 13.1 % (ref 11.5–15.5)
WBC: 7.1 10*3/uL (ref 4.0–10.5)

## 2015-12-05 LAB — SURGICAL PCR SCREEN
MRSA, PCR: POSITIVE — AB
Staphylococcus aureus: POSITIVE — AB

## 2015-12-05 LAB — PROTIME-INR
INR: 1.02 (ref 0.00–1.49)
PROTHROMBIN TIME: 13.6 s (ref 11.6–15.2)

## 2015-12-05 LAB — NO BLOOD PRODUCTS

## 2015-12-05 NOTE — Progress Notes (Addendum)
Anesthesia PAT Evaluation: Patient is an 82 year old female scheduled for left carotid enterectomy 12/11/15 by Dr. Oneida Alar.   History includes former smoker, carotid artery stenosis, HTN, GERD, RLS, hypercholesterolemia, anemia, COPD, PNA, HOH, CAD s/p proximal LAD stent '06, enlarged heart (mild LVH on 04/2015 echo), PVD, recurrent syncope '09 and '16, s/p left TKA 04/2011, L3-4 fusion '13, right hip bursectomy '14, appendectomy, hysterectomy. Her anesthesia history includes post-operative hypotension and difficult waking up from anesthesia. 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   PCP is Marrianne Mood, FNP at [facility]).  Neurologist is Dr. Charlaine Dalton at [facility] in [place] ([facility])  Cardiologist is Dr. Beatriz Stallion [phone]), last visit 11/26/15 for follow-up and preoperative evaluation. Her note states: "# Preoperative evaluation prior to noncardiac surgery (left carotid artery endarterectomy with Dr. Juanda Crumble feels, vascular surgeon at vascular and vein specialist at [facility]) - Her ECG today show normal sinus rhythm with heart rate of 61 bpm with normal ECG. - She can do more than 4 METs and clinically stable so I recommend no further cardiac testing prior to her noncardiac surgery.  - She is an intermediate risk patient who will undergo an intermediate risk surgery.  - She should continue her aspirin 81 mg once daily prior, during and after the procedure if possible." (However, during her PAT visit, patient reported intermittent episodes of mid to left chest pain tightness or heaviness that lasts a few minutes. Symptoms are not necessarily associated with exertion. Typically occurs about once a month. She has Nitro X 2 for two of the episodes with relief but on other occasions it has gone away on its own.  There is no associated diaphoresis, nausea, jaw or arm pain. Last episode  was approximately 3 weeks ago. No edema or SOB at rest. She has also noticed that she awakens almost nightly to her heart "pounding" with what sounds like pulsatile tinnitus in her left ear. This started about 3-4 weeks ago and is separate from her chest heaviness. She has also noticed more dizziness in general. She walks with a cane since her hip surgery in 2014 (reportedly due to her left leg now being "1/2 inch shorter."). Otherwise she feels like she stays relatively active, ie, she painted 3 rooms in the last week, she mows her lawn with a riding lawnmower, does gardening (small area), and does light house work. She has noticed gradual fatigue over the past two years, but no sudden change.)  PAT Vitals: BP 147/49, HR 72, RR 18, T 36.7C, O2 sat 96%. Exam shows a pleasant Caucasian female in NAD. She is sitting in a hospital wheelchair. Daugther at side. No conversational dyspnea. Heart RRR, no murmur noted. Lungs clear. No ankle edema. Adequate mouth opening. 11/26/15 EKG (Dr. Cloyd Stagers): NSR.  04/18/15 Echo [facility]; Care Everywhere): Result Narrative   Left ventricular hypertrophy - mild  Normal left ventricular systolic function  Degenerative mitral valve disease  Mitral annular calcification  Aortic sclerosis  Diastolic dysfunction - grade II (elevated filling pressures)  Dilated left atrium - mild  Normal right ventricular systolic function  Tricuspid regurgitation - mild to moderate  Elevated pulmonary artery systolic pressure - mild  No intracardiac and intrapulmonary shunt  03/03/11 Nuclear stress test [facility]; scanned under Media tab, Correspondence 11/23/12): Impression: 1. There is a small, mild, fixed mid-inferior lateral defect secondary to attenuation or possibly scar. 2. There is mild to moderate diaphragmatic attenuation,  sensitivity and specificity is reduced by the noted attenuation, no evidence of myocardial ischemia, EF 65% with normal wall motion. (This  stress test was done prior to her TKA in 04/2011 after Doug Sou, NP referred her to [place] Dr. Kandis Cocking for chest pain.) I requested more recent Cardiology notes, if available. (Update: no more recent Cardiology notes at [facility].)  10/21/15 Carotid U/S: Impression:  1. Bilateral carotid bifurcation and proximal ICA plaque, resulting in 70-99% diameter proximal LIAC stenosis, 50-69% diameter RICA stenosis.  07/24/15 EEG: Interpretation: This is a normal, routine, adult, awake and drowsy EEG.  04/12/2015 Ziopatch Holter Monitor [facility]; Care Everywhere): Conclusions: - Ambulatory ECG monitoring was performed from 04/12/15 to 04/25/15, with 12 days 19 hours of analyzable data. - The predominant rhythm was sinus rhythm, with the rate ranging from 50 to 115 and averaging 72 bpm. - Rare supraventricular ectopics were recorded, with short runs of atrial tachycardia.  - No ventricular ectopics were recorded. - Patient-initiated recordings/events revealed mostly normal sinus rhythm and some episodes of rare PACs. - The patient reported lightheadedness, dizziness, neck and arm pain and tingling.  04/12/15 CXR [facility]; Care Everywhere): IMPRESSION: No active cardiopulmonary disease.  Preoperative labs noted. Cr 1.04. CBC, PT/PTT, UA WNL.  Patient with intermittent chest tightness/heaviness over the past several months. None currently. She has both typical and atypical features. We discussed that I will need to relay her symptoms to Dr. Cloyd Stagers who may want her to have a pre-operative stress test. She has Nitro if needed for recurrent chest pain and understands to contact 911 if symptoms not relieved after 2 Nitro, or sooner if felt indicated based on her symptoms. I have relayed above with [facility] RN Arbie Cookey and to Dr. Charlsie Merles nurse Tressia Miners. I asked Traci to have Dr. Cloyd Stagers contact patient with further recommendations.   George Hugh [facility] Stay Center/Anesthesiology Phone [phone] 12/05/2015 11:30 AM  Addendum: According to notes in Care Everywhere, it appears that Dr. Cloyd Stagers is ordering a pre-operative stress test. It also indicates that patient is very upset with me (indicating she only told me that she had mild chest pressure once and heart pounding once). That is not my recollection of our conversation. I will update staff at Dr. Oneida Alar' office. In addition, I forgot to mention that during my exam yesterday, I noted a scab with ~ 4 cm of surrounding erythema. There was no "bulls eye" type rash. She reported it was from a tick bite 1-2 weeks ago that had turned red after her scratching the area. She felt it was improving. I did tell her to follow-up with her PCP if erythema did not improve (could interfere with timing of surgery if active infection) or if she developed symptoms such as joint pain. I will let [facility] staff know about this as well.   George Hugh [facility] Short Stay Center/Anesthesiology Phone [phone] 12/06/2015 6:50 PM  Addendum:  12/09/15 Nuclear stress test [facility]; Care Everywhere): Impressions: - Low risk study - No significant ischemia is noted on perfusion imaging - There is a moderate in size, moderate in severity, fixed defect  involving the apical anterior, apical lateral and apical segments. This  is consistent with scar or less likely due to attenuation artifact. - Post stress: Global systolic function is normal. The ejection fraction  was greater than 65%. - Breast attenuation is noted  George Hugh [facility] Short Stay Center/Anesthesiology Phone [phone] 12/10/2015 9:14 AM

## 2015-12-05 NOTE — Pre-Procedure Instructions (Signed)
    [name]  12/05/2015      [place], [place] [address] Phone: [phone] Fax: [phone]  [facility] [place], [place] [address] Phone: [phone] Fax: [phone]    Your procedure is scheduled on Wednesday, Dec 11, 2015 at 8:30 AM.  Report to [facility] Entrance "A" Admitting Office at 6:30 AM.  Call this number if you have problems the morning of surgery: [phone]  Any questions prior to day of surgery, please call [phone] between 8 & 4 PM.   Remember:  Do not eat food or drink liquids after midnight Tuesday, 12/10/15.  Take these medicines the morning of surgery with A SIP OF WATER: Amlodipine (Norvasc), Aspirin, Esomeprazole (Nexium), Fesoterodine (Toviaz), Flonase nasal spray, Oxycodone - if needed   Do not wear jewelry, make-up or nail polish.  Do not wear lotions, powders, or perfumes.  You may not wear deodorant.  Do not shave 48 hours prior to surgery.   Do not bring valuables to the hospital.  [facility] is not responsible for any belongings or valuables.  Contacts, dentures or bridgework may not be worn into surgery.  Leave your suitcase in the car.  After surgery it may be brought to your room.  For patients admitted to the hospital, discharge time will be determined by your treatment team.  Special instructions:  See "Preparing for Surgery" Instruction sheet.  Please read over the following fact sheets that you were given. Pain Booklet, Coughing and Deep Breathing, MRSA Information and Surgical Site Infection Prevention

## 2015-12-05 NOTE — Progress Notes (Addendum)
[id number] Carlis Abbott, NP @ [facility] in [place] Cardiologist: Dr. Donna Christen in [place]  Pt. States she has had heaviness in her chest, last episode was 2 weeks ago. States it happens once a month or every 2-3 months and will go away with nitroglycerine. States she went to her cardiologist and she didn't require any further testing.   Mupirocin prescription called to [facility].

## 2015-12-11 ENCOUNTER — Inpatient Hospital Stay (HOSPITAL_COMMUNITY): Payer: Medicare Other | Admitting: Anesthesiology

## 2015-12-11 ENCOUNTER — Encounter (HOSPITAL_COMMUNITY)
Admission: RE | Disposition: A | Discharge status: Home / Self Care | Payer: Self-pay | Source: Ambulatory Visit | Attending: Vascular Surgery

## 2015-12-11 ENCOUNTER — Inpatient Hospital Stay (HOSPITAL_COMMUNITY): Payer: Medicare Other | Admitting: Vascular Surgery

## 2015-12-11 ENCOUNTER — Inpatient Hospital Stay (HOSPITAL_COMMUNITY)
Admission: RE | Admit: 2015-12-11 | Discharge: 2015-12-12 | DRG: 039 | Disposition: A | Discharge status: Home / Self Care | Payer: Medicare Other | Source: Ambulatory Visit | Attending: Vascular Surgery | Admitting: Vascular Surgery

## 2015-12-11 ENCOUNTER — Encounter (HOSPITAL_COMMUNITY): Payer: Self-pay | Admitting: *Deleted

## 2015-12-11 DIAGNOSIS — Z882 Allergy status to sulfonamides status: Secondary | ICD-10-CM

## 2015-12-11 DIAGNOSIS — Z90710 Acquired absence of both cervix and uterus: Secondary | ICD-10-CM | POA: Diagnosis not present

## 2015-12-11 DIAGNOSIS — Z7982 Long term (current) use of aspirin: Secondary | ICD-10-CM | POA: Diagnosis not present

## 2015-12-11 DIAGNOSIS — I119 Hypertensive heart disease without heart failure: Secondary | ICD-10-CM | POA: Diagnosis present

## 2015-12-11 DIAGNOSIS — H9190 Unspecified hearing loss, unspecified ear: Secondary | ICD-10-CM | POA: Diagnosis present

## 2015-12-11 DIAGNOSIS — Z9841 Cataract extraction status, right eye: Secondary | ICD-10-CM

## 2015-12-11 DIAGNOSIS — Z87891 Personal history of nicotine dependence: Secondary | ICD-10-CM | POA: Diagnosis not present

## 2015-12-11 DIAGNOSIS — Z955 Presence of coronary angioplasty implant and graft: Secondary | ICD-10-CM | POA: Diagnosis not present

## 2015-12-11 DIAGNOSIS — Z79899 Other long term (current) drug therapy: Secondary | ICD-10-CM

## 2015-12-11 DIAGNOSIS — I2510 Atherosclerotic heart disease of native coronary artery without angina pectoris: Secondary | ICD-10-CM | POA: Diagnosis present

## 2015-12-11 DIAGNOSIS — I6522 Occlusion and stenosis of left carotid artery: Secondary | ICD-10-CM | POA: Diagnosis present

## 2015-12-11 DIAGNOSIS — I6529 Occlusion and stenosis of unspecified carotid artery: Secondary | ICD-10-CM | POA: Diagnosis present

## 2015-12-11 DIAGNOSIS — Z885 Allergy status to narcotic agent status: Secondary | ICD-10-CM | POA: Diagnosis not present

## 2015-12-11 DIAGNOSIS — Z91040 Latex allergy status: Secondary | ICD-10-CM

## 2015-12-11 DIAGNOSIS — H9319 Tinnitus, unspecified ear: Secondary | ICD-10-CM | POA: Diagnosis present

## 2015-12-11 DIAGNOSIS — J449 Chronic obstructive pulmonary disease, unspecified: Secondary | ICD-10-CM | POA: Diagnosis present

## 2015-12-11 DIAGNOSIS — Z9842 Cataract extraction status, left eye: Secondary | ICD-10-CM | POA: Diagnosis not present

## 2015-12-11 DIAGNOSIS — I739 Peripheral vascular disease, unspecified: Secondary | ICD-10-CM | POA: Diagnosis present

## 2015-12-11 DIAGNOSIS — Z8249 Family history of ischemic heart disease and other diseases of the circulatory system: Secondary | ICD-10-CM | POA: Diagnosis not present

## 2015-12-11 DIAGNOSIS — Z91018 Allergy to other foods: Secondary | ICD-10-CM | POA: Diagnosis not present

## 2015-12-11 DIAGNOSIS — Z96652 Presence of left artificial knee joint: Secondary | ICD-10-CM | POA: Diagnosis present

## 2015-12-11 HISTORY — PX: ENDARTERECTOMY: SHX5162

## 2015-12-11 LAB — CBC
HCT: 33.5 % — ABNORMAL LOW (ref 36.0–46.0)
HEMOGLOBIN: 10.3 g/dL — AB (ref 12.0–15.0)
MCH: 27 pg (ref 26.0–34.0)
MCHC: 30.7 g/dL (ref 30.0–36.0)
MCV: 87.7 fL (ref 78.0–100.0)
Platelets: 176 10*3/uL (ref 150–400)
RBC: 3.82 MIL/uL — ABNORMAL LOW (ref 3.87–5.11)
RDW: 13.6 % (ref 11.5–15.5)
WBC: 7.5 10*3/uL (ref 4.0–10.5)

## 2015-12-11 LAB — CREATININE, SERUM
CREATININE: 1.25 mg/dL — AB (ref 0.44–1.00)
GFR calc Af Amer: 45 mL/min — ABNORMAL LOW (ref >60)
GFR calc non Af Amer: 39 mL/min — ABNORMAL LOW (ref >60)

## 2015-12-11 SURGERY — ENDARTERECTOMY, CAROTID
Anesthesia: General | Site: Neck | Laterality: Left

## 2015-12-11 MED ORDER — IPRATROPIUM-ALBUTEROL 0.5-2.5 (3) MG/3ML IN SOLN: 3.0000 mL | RESPIRATORY_TRACT | Status: DC | PRN

## 2015-12-11 MED ORDER — HYDRALAZINE HCL 20 MG/ML IJ SOLN
5.0000 mg | INTRAMUSCULAR | Status: AC | PRN
  Administered 2015-12-11 (×2): 5 mg via INTRAVENOUS

## 2015-12-11 MED ORDER — ACETAMINOPHEN 325 MG PO TABS
ORAL_TABLET | ORAL | Status: AC
  Filled 2015-12-11: qty 2

## 2015-12-11 MED ORDER — CHLORHEXIDINE GLUCONATE CLOTH 2 % EX PADS: 6.0000 | MEDICATED_PAD | Freq: Once | CUTANEOUS | Status: DC

## 2015-12-11 MED ORDER — POTASSIUM CHLORIDE CRYS ER 20 MEQ PO TBCR: 20.0000 meq | EXTENDED_RELEASE_TABLET | Freq: Every day | ORAL | Status: DC | PRN

## 2015-12-11 MED ORDER — LIDOCAINE 2% (20 MG/ML) 5 ML SYRINGE
INTRAMUSCULAR | Status: AC
  Filled 2015-12-11: qty 5

## 2015-12-11 MED ORDER — OXYCODONE HCL 5 MG PO TABS
ORAL_TABLET | ORAL | Status: AC
  Filled 2015-12-11: qty 3

## 2015-12-11 MED ORDER — SUGAMMADEX SODIUM 200 MG/2ML IV SOLN
INTRAVENOUS | Status: DC | PRN
  Administered 2015-12-11: 137 mg via INTRAVENOUS

## 2015-12-11 MED ORDER — ONDANSETRON HCL 4 MG/2ML IJ SOLN
INTRAMUSCULAR | Status: DC | PRN
Start: 2015-12-11 — End: 2015-12-11
  Administered 2015-12-11: 4 mg via INTRAVENOUS

## 2015-12-11 MED ORDER — 0.9 % SODIUM CHLORIDE (POUR BTL) OPTIME
TOPICAL | Status: DC | PRN
  Administered 2015-12-11: 1000 mL

## 2015-12-11 MED ORDER — LABETALOL HCL 5 MG/ML IV SOLN: 10.0000 mg | INTRAVENOUS | Status: DC | PRN

## 2015-12-11 MED ORDER — OXYCODONE HCL 5 MG PO TABS
ORAL_TABLET | ORAL | Status: AC
  Filled 2015-12-11: qty 2

## 2015-12-11 MED ORDER — PHENYLEPHRINE 40 MCG/ML (10ML) SYRINGE FOR IV PUSH (FOR BLOOD PRESSURE SUPPORT)
PREFILLED_SYRINGE | INTRAVENOUS | Status: AC
  Filled 2015-12-11: qty 10

## 2015-12-11 MED ORDER — TORSEMIDE 20 MG PO TABS
20.0000 mg | ORAL_TABLET | Freq: Two times a day (BID) | ORAL | Status: DC
  Administered 2015-12-11 – 2015-12-12 (×2): 20 mg via ORAL
  Filled 2015-12-11 (×2): qty 1

## 2015-12-11 MED ORDER — DEXTROSE 5 % IV SOLN
1.5000 g | Freq: Two times a day (BID) | INTRAVENOUS | Status: AC
  Administered 2015-12-11 – 2015-12-12 (×2): 1.5 g via INTRAVENOUS
  Filled 2015-12-11 (×2): qty 1.5

## 2015-12-11 MED ORDER — MIRTAZAPINE 15 MG PO TABS
15.0000 mg | ORAL_TABLET | Freq: Every day | ORAL | Status: DC
  Administered 2015-12-11: 15 mg via ORAL
  Filled 2015-12-11: qty 1

## 2015-12-11 MED ORDER — POLYETHYLENE GLYCOL 3350 17 G PO PACK: 17.0000 g | PACK | Freq: Every day | ORAL | Status: DC | PRN

## 2015-12-11 MED ORDER — OXYCODONE HCL 5 MG PO TABS
10.0000 mg | ORAL_TABLET | ORAL | Status: DC | PRN
  Administered 2015-12-11 – 2015-12-12 (×4): 10 mg via ORAL
  Filled 2015-12-11 (×2): qty 2

## 2015-12-11 MED ORDER — OXYCODONE HCL 5 MG/5ML PO SOLN: 5.0000 mg | Freq: Once | ORAL | Status: AC | PRN

## 2015-12-11 MED ORDER — FENTANYL CITRATE (PF) 100 MCG/2ML IJ SOLN
25.0000 ug | INTRAMUSCULAR | Status: DC | PRN
  Administered 2015-12-11: 25 ug via INTRAVENOUS
  Administered 2015-12-11: 50 ug via INTRAVENOUS
  Administered 2015-12-11: 25 ug via INTRAVENOUS
  Administered 2015-12-11: 50 ug via INTRAVENOUS

## 2015-12-11 MED ORDER — MIDAZOLAM HCL 5 MG/5ML IJ SOLN
INTRAMUSCULAR | Status: DC | PRN
Start: 2015-12-11 — End: 2015-12-11

## 2015-12-11 MED ORDER — FESOTERODINE FUMARATE ER 8 MG PO TB24
8.0000 mg | ORAL_TABLET | Freq: Every day | ORAL | Status: DC
  Administered 2015-12-12: 8 mg via ORAL
  Filled 2015-12-11: qty 1

## 2015-12-11 MED ORDER — SODIUM CHLORIDE 0.9 % IV SOLN
0.0125 ug/kg/min | INTRAVENOUS | Status: AC
  Administered 2015-12-11: 10:00:00 via INTRAVENOUS
  Administered 2015-12-11: .25 ug/kg/min via INTRAVENOUS
  Filled 2015-12-11: qty 2000

## 2015-12-11 MED ORDER — FENTANYL CITRATE (PF) 100 MCG/2ML IJ SOLN
INTRAMUSCULAR | Status: AC
  Administered 2015-12-11: 25 ug via INTRAVENOUS
  Filled 2015-12-11: qty 2

## 2015-12-11 MED ORDER — PHENOL 1.4 % MT LIQD: 1.0000 | OROMUCOSAL | Status: DC | PRN

## 2015-12-11 MED ORDER — METOPROLOL TARTRATE 5 MG/5ML IV SOLN: 2.0000 mg | INTRAVENOUS | Status: DC | PRN

## 2015-12-11 MED ORDER — OXYCODONE HCL 5 MG PO TABS
5.0000 mg | ORAL_TABLET | Freq: Once | ORAL | Status: AC | PRN
  Administered 2015-12-11: 5 mg via ORAL

## 2015-12-11 MED ORDER — SODIUM CHLORIDE 0.45 % IV SOLN
INTRAVENOUS | Status: DC
  Administered 2015-12-11: 21:00:00 via INTRAVENOUS

## 2015-12-11 MED ORDER — THROMBIN 20000 UNITS EX SOLR
CUTANEOUS | Status: AC
  Filled 2015-12-11: qty 20000

## 2015-12-11 MED ORDER — FLUTICASONE PROPIONATE 50 MCG/ACT NA SUSP
1.0000 | Freq: Every day | NASAL | Status: DC
  Filled 2015-12-11: qty 16

## 2015-12-11 MED ORDER — LIDOCAINE HCL (CARDIAC) 20 MG/ML IV SOLN
INTRAVENOUS | Status: DC | PRN
  Administered 2015-12-11: 50 mg via INTRAVENOUS

## 2015-12-11 MED ORDER — AMLODIPINE BESYLATE 5 MG PO TABS
5.0000 mg | ORAL_TABLET | Freq: Every day | ORAL | Status: DC
  Filled 2015-12-11: qty 1

## 2015-12-11 MED ORDER — MORPHINE SULFATE (PF) 2 MG/ML IV SOLN
INTRAVENOUS | Status: AC
  Filled 2015-12-11: qty 1

## 2015-12-11 MED ORDER — ALUM & MAG HYDROXIDE-SIMETH 200-200-20 MG/5ML PO SUSP: 15.0000 mL | ORAL | Status: DC | PRN

## 2015-12-11 MED ORDER — ACETAMINOPHEN 325 MG PO TABS
325.0000 mg | ORAL_TABLET | ORAL | Status: DC | PRN
  Administered 2015-12-12: 650 mg via ORAL
  Filled 2015-12-11: qty 2

## 2015-12-11 MED ORDER — HEPARIN SODIUM (PORCINE) 1000 UNIT/ML IJ SOLN
INTRAMUSCULAR | Status: AC
  Filled 2015-12-11: qty 1

## 2015-12-11 MED ORDER — VECURONIUM BROMIDE 10 MG IV SOLR
INTRAVENOUS | Status: AC
  Filled 2015-12-11: qty 10

## 2015-12-11 MED ORDER — SODIUM CHLORIDE 0.9 % IV SOLN: 500.0000 mL | Freq: Once | INTRAVENOUS | Status: DC | PRN

## 2015-12-11 MED ORDER — SODIUM CHLORIDE 0.9 % IV SOLN: INTRAVENOUS | Status: DC

## 2015-12-11 MED ORDER — POTASSIUM CHLORIDE CRYS ER 20 MEQ PO TBCR
20.0000 meq | EXTENDED_RELEASE_TABLET | Freq: Two times a day (BID) | ORAL | Status: DC
  Administered 2015-12-11 – 2015-12-12 (×2): 20 meq via ORAL
  Filled 2015-12-11 (×2): qty 1

## 2015-12-11 MED ORDER — SUGAMMADEX SODIUM 200 MG/2ML IV SOLN
INTRAVENOUS | Status: AC
  Filled 2015-12-11: qty 2

## 2015-12-11 MED ORDER — PANTOPRAZOLE SODIUM 40 MG PO TBEC: 80.0000 mg | DELAYED_RELEASE_TABLET | Freq: Every day | ORAL | Status: DC

## 2015-12-11 MED ORDER — PROTAMINE SULFATE 10 MG/ML IV SOLN
INTRAVENOUS | Status: AC
  Filled 2015-12-11: qty 5

## 2015-12-11 MED ORDER — MORPHINE SULFATE (PF) 2 MG/ML IV SOLN
2.0000 mg | INTRAVENOUS | Status: DC | PRN
  Administered 2015-12-11: 5 mg via INTRAVENOUS

## 2015-12-11 MED ORDER — ROCURONIUM BROMIDE 100 MG/10ML IV SOLN
INTRAVENOUS | Status: DC | PRN
  Administered 2015-12-11: 50 mg via INTRAVENOUS

## 2015-12-11 MED ORDER — ACETAMINOPHEN 325 MG PO TABS: 650.0000 mg | ORAL_TABLET | Freq: Once | ORAL | Status: DC

## 2015-12-11 MED ORDER — DEXTROSE 5 % IV SOLN
1.5000 g | INTRAVENOUS | Status: AC
  Administered 2015-12-11: 1.5 g via INTRAVENOUS
  Filled 2015-12-11: qty 1.5

## 2015-12-11 MED ORDER — MONTELUKAST SODIUM 10 MG PO TABS
10.0000 mg | ORAL_TABLET | Freq: Every day | ORAL | Status: DC
  Administered 2015-12-11: 10 mg via ORAL
  Filled 2015-12-11: qty 1

## 2015-12-11 MED ORDER — LABETALOL HCL 5 MG/ML IV SOLN
INTRAVENOUS | Status: DC | PRN
  Administered 2015-12-11: 20 mg via INTRAVENOUS

## 2015-12-11 MED ORDER — SODIUM CHLORIDE 0.9 % IV SOLN
10.0000 mg | INTRAVENOUS | Status: DC | PRN
  Administered 2015-12-11: 25 ug/min via INTRAVENOUS

## 2015-12-11 MED ORDER — PROTAMINE SULFATE 10 MG/ML IV SOLN
INTRAVENOUS | Status: DC | PRN
  Administered 2015-12-11: 70 mg via INTRAVENOUS

## 2015-12-11 MED ORDER — ACETAMINOPHEN 160 MG/5ML PO SOLN
325.0000 mg | ORAL | Status: DC | PRN
  Administered 2015-12-11: 650 mg via ORAL

## 2015-12-11 MED ORDER — FENTANYL CITRATE (PF) 250 MCG/5ML IJ SOLN
INTRAMUSCULAR | Status: AC
Start: 2015-12-11 — End: 2015-12-11
  Filled 2015-12-11: qty 5

## 2015-12-11 MED ORDER — ROCURONIUM BROMIDE 50 MG/5ML IV SOLN
INTRAVENOUS | Status: AC
  Filled 2015-12-11: qty 1

## 2015-12-11 MED ORDER — ACETAMINOPHEN 650 MG RE SUPP: 325.0000 mg | RECTAL | Status: DC | PRN

## 2015-12-11 MED ORDER — OXYCODONE HCL 10 MG PO TABS: 10.0000 mg | ORAL_TABLET | ORAL | Status: AC | PRN

## 2015-12-11 MED ORDER — DOCUSATE SODIUM 100 MG PO CAPS
100.0000 mg | ORAL_CAPSULE | Freq: Every day | ORAL | Status: DC
  Administered 2015-12-12: 100 mg via ORAL
  Filled 2015-12-11: qty 1

## 2015-12-11 MED ORDER — ATORVASTATIN CALCIUM 10 MG PO TABS
10.0000 mg | ORAL_TABLET | Freq: Every day | ORAL | Status: DC
  Administered 2015-12-11: 10 mg via ORAL
  Filled 2015-12-11: qty 1

## 2015-12-11 MED ORDER — GUAIFENESIN-DM 100-10 MG/5ML PO SYRP: 15.0000 mL | ORAL_SOLUTION | ORAL | Status: DC | PRN

## 2015-12-11 MED ORDER — ONDANSETRON HCL 4 MG/2ML IJ SOLN
INTRAMUSCULAR | Status: AC
  Filled 2015-12-11: qty 2

## 2015-12-11 MED ORDER — HEPARIN SODIUM (PORCINE) 1000 UNIT/ML IJ SOLN
INTRAMUSCULAR | Status: DC | PRN
  Administered 2015-12-11: 7000 [IU] via INTRAVENOUS

## 2015-12-11 MED ORDER — ACETAMINOPHEN 325 MG PO TABS: 325.0000 mg | ORAL_TABLET | ORAL | Status: DC | PRN

## 2015-12-11 MED ORDER — MAGNESIUM SULFATE 2 GM/50ML IV SOLN: 2.0000 g | Freq: Every day | INTRAVENOUS | Status: DC | PRN

## 2015-12-11 MED ORDER — PROPOFOL 10 MG/ML IV BOLUS
INTRAVENOUS | Status: DC | PRN
Start: 2015-12-11 — End: 2015-12-11
  Administered 2015-12-11: 200 mg via INTRAVENOUS

## 2015-12-11 MED ORDER — LACTATED RINGERS IV SOLN
INTRAVENOUS | Status: DC | PRN
Start: 2015-12-11 — End: 2015-12-11
  Administered 2015-12-11: 08:00:00 via INTRAVENOUS

## 2015-12-11 MED ORDER — ONDANSETRON HCL 4 MG/2ML IJ SOLN: 4.0000 mg | Freq: Four times a day (QID) | INTRAMUSCULAR | Status: DC | PRN

## 2015-12-11 MED ORDER — SODIUM CHLORIDE 0.9 % IV SOLN
0.0125 ug/kg/min | INTRAVENOUS | Status: DC
  Filled 2015-12-11: qty 1000

## 2015-12-11 MED ORDER — LIDOCAINE HCL (PF) 1 % IJ SOLN
INTRAMUSCULAR | Status: AC
  Filled 2015-12-11: qty 30

## 2015-12-11 MED ORDER — ASPIRIN EC 81 MG PO TBEC
81.0000 mg | DELAYED_RELEASE_TABLET | Freq: Every day | ORAL | Status: DC
  Administered 2015-12-12: 81 mg via ORAL
  Filled 2015-12-11: qty 1

## 2015-12-11 MED ORDER — ROPINIROLE HCL 1 MG PO TABS
2.0000 mg | ORAL_TABLET | Freq: Every day | ORAL | Status: DC
  Administered 2015-12-11: 2 mg via ORAL
  Filled 2015-12-11: qty 2

## 2015-12-11 MED ORDER — PROPOFOL 10 MG/ML IV BOLUS
INTRAVENOUS | Status: AC
Start: 2015-12-11 — End: 2015-12-11
  Filled 2015-12-11: qty 20

## 2015-12-11 MED ORDER — SODIUM CHLORIDE 0.9 % IV SOLN
INTRAVENOUS | Status: DC | PRN
  Administered 2015-12-11: 500 mL

## 2015-12-11 MED ORDER — ZOLPIDEM TARTRATE 5 MG PO TABS
5.0000 mg | ORAL_TABLET | Freq: Every day | ORAL | Status: DC
  Administered 2015-12-11: 5 mg via ORAL
  Filled 2015-12-11: qty 1

## 2015-12-11 MED ORDER — LACTATED RINGERS IV SOLN
INTRAVENOUS | Status: DC | PRN
  Administered 2015-12-11: 08:00:00 via INTRAVENOUS

## 2015-12-11 MED ORDER — ARTIFICIAL TEARS OP OINT
TOPICAL_OINTMENT | OPHTHALMIC | Status: DC | PRN
  Administered 2015-12-11: 1 via OPHTHALMIC

## 2015-12-11 MED ORDER — HYDRALAZINE HCL 20 MG/ML IJ SOLN
INTRAMUSCULAR | Status: AC
  Filled 2015-12-11: qty 1

## 2015-12-11 MED ORDER — ENOXAPARIN SODIUM 40 MG/0.4ML ~~LOC~~ SOLN: 40.0000 mg | SUBCUTANEOUS | Status: DC

## 2015-12-11 MED ORDER — MORPHINE SULFATE (PF) 4 MG/ML IV SOLN
INTRAVENOUS | Status: AC
  Filled 2015-12-11: qty 1

## 2015-12-11 MED ORDER — LABETALOL HCL 5 MG/ML IV SOLN
INTRAVENOUS | Status: AC
  Filled 2015-12-11: qty 4

## 2015-12-11 MED ORDER — FENTANYL CITRATE (PF) 100 MCG/2ML IJ SOLN
INTRAMUSCULAR | Status: DC | PRN
  Administered 2015-12-11 (×2): 50 ug via INTRAVENOUS
  Administered 2015-12-11: 150 ug via INTRAVENOUS

## 2015-12-11 MED ORDER — NITROGLYCERIN 0.4 MG SL SUBL: 0.4000 mg | SUBLINGUAL_TABLET | SUBLINGUAL | Status: DC | PRN

## 2015-12-11 MED ORDER — ARTIFICIAL TEARS OP OINT
TOPICAL_OINTMENT | OPHTHALMIC | Status: AC
  Filled 2015-12-11: qty 3.5

## 2015-12-11 MED ORDER — THROMBIN 20000 UNITS EX SOLR
CUTANEOUS | Status: DC | PRN
  Administered 2015-12-11: 20 mL via TOPICAL

## 2015-12-11 SURGICAL SUPPLY — 45 items
CANISTER SUCTION 2500CC (MISCELLANEOUS) ×2 IMPLANT
CANNULA VESSEL 3MM 2 BLNT TIP (CANNULA) ×2 IMPLANT
CATH ROBINSON RED A/P 18FR (CATHETERS) IMPLANT
CLIP TI MEDIUM 6 (CLIP) ×2 IMPLANT
CLIP TI WIDE RED SMALL 6 (CLIP) ×2 IMPLANT
CRADLE DONUT ADULT HEAD (MISCELLANEOUS) ×2 IMPLANT
DECANTER SPIKE VIAL GLASS SM (MISCELLANEOUS) IMPLANT
DRAIN HEMOVAC 1/8 X 5 (WOUND CARE) IMPLANT
ELECT REM PT RETURN 9FT ADLT (ELECTROSURGICAL) ×2
ELECTRODE REM PT RTRN 9FT ADLT (ELECTROSURGICAL) ×1 IMPLANT
EVACUATOR SILICONE 100CC (DRAIN) IMPLANT
GAUZE SPONGE 4X4 12PLY STRL (GAUZE/BANDAGES/DRESSINGS) ×2 IMPLANT
GEL ULTRASOUND 20GR AQUASONIC (MISCELLANEOUS) IMPLANT
GLOVE BIO SURGEON STRL SZ7.5 (GLOVE) IMPLANT
GLOVE BIOGEL PI IND STRL 6.5 (GLOVE) ×2 IMPLANT
GLOVE BIOGEL PI INDICATOR 6.5 (GLOVE) ×2
GLOVE SURG SS PI 6.5 STRL IVOR (GLOVE) ×2 IMPLANT
GLOVE SURG SS PI 7.5 STRL IVOR (GLOVE) ×4 IMPLANT
GOWN STRL REUS W/ TWL LRG LVL3 (GOWN DISPOSABLE) ×2 IMPLANT
GOWN STRL REUS W/TWL LRG LVL3 (GOWN DISPOSABLE) ×2
GOWN STRL REUS W/TWL XL LVL3 (GOWN DISPOSABLE) ×2 IMPLANT
KIT BASIN OR (CUSTOM PROCEDURE TRAY) ×2 IMPLANT
KIT ROOM TURNOVER OR (KITS) ×2 IMPLANT
KIT SUCTION CATH 14FR (SUCTIONS) ×2 IMPLANT
LIQUID BAND (GAUZE/BANDAGES/DRESSINGS) ×2 IMPLANT
LOOP VESSEL MINI RED (MISCELLANEOUS) IMPLANT
NEEDLE HYPO 25GX1X1/2 BEV (NEEDLE) IMPLANT
NS IRRIG 1000ML POUR BTL (IV SOLUTION) ×4 IMPLANT
PACK CAROTID (CUSTOM PROCEDURE TRAY) ×2 IMPLANT
PAD ARMBOARD 7.5X6 YLW CONV (MISCELLANEOUS) ×4 IMPLANT
PATCH HEMASHIELD 8X75 (Vascular Products) ×2 IMPLANT
SHUNT CAROTID BYPASS 10 (VASCULAR PRODUCTS) ×2 IMPLANT
SHUNT CAROTID BYPASS 12FRX15.5 (VASCULAR PRODUCTS) IMPLANT
SPONGE INTESTINAL PEANUT (DISPOSABLE) IMPLANT
SPONGE SURGIFOAM ABS GEL 100 (HEMOSTASIS) IMPLANT
SUT ETHILON 3 0 PS 1 (SUTURE) IMPLANT
SUT PROLENE 6 0 CC (SUTURE) ×4 IMPLANT
SUT PROLENE 7 0 BV 1 (SUTURE) IMPLANT
SUT SILK 3 0 TIES 17X18 (SUTURE)
SUT SILK 3-0 18XBRD TIE BLK (SUTURE) IMPLANT
SUT VIC AB 3-0 SH 27 (SUTURE) ×1
SUT VIC AB 3-0 SH 27X BRD (SUTURE) ×1 IMPLANT
SUT VICRYL 4-0 PS2 18IN ABS (SUTURE) ×2 IMPLANT
SYR CONTROL 10ML LL (SYRINGE) IMPLANT
WATER STERILE IRR 1000ML POUR (IV SOLUTION) ×2 IMPLANT

## 2015-12-11 NOTE — Interval H&P Note (Signed)
History and Physical Interval Note:  12/11/2015 7:51 AM  [name]  has presented today for surgery, with the diagnosis of Left carotid artery stenosis I65.22  The various methods of treatment have been discussed with the patient and family. After consideration of risks, benefits and other options for treatment, the patient has consented to  Procedure(s): ENDARTERECTOMY CAROTID (Left) as a surgical intervention .  The patient's history has been reviewed, patient examined, no change in status, stable for surgery.  I have reviewed the patient's chart and labs.  Questions were answered to the patient's satisfaction.     Ruta Hinds

## 2015-12-11 NOTE — Progress Notes (Signed)
Anesthesia follow-up: I received a voice message from Dr. Cloyd Stagers this morning indicating that she had reviewed patient's recent stress test and felt patient was okay to proceed with carotid surgery today as planned.  George Hugh [facility] Short Stay Center/Anesthesiology Phone [phone] 12/11/2015 9:27 AM

## 2015-12-11 NOTE — Anesthesia Preprocedure Evaluation (Signed)
Anesthesia Evaluation  Patient identified by MRN, date of birth, ID band Patient awake    Reviewed: Allergy & Precautions, NPO status , Patient's Chart, lab work & pertinent test results  History of Anesthesia Complications Negative for: history of anesthetic complications  Airway Mallampati: II  TM Distance: >3 FB Neck ROM: Full    Dental  (+) Chipped, Teeth Intact,    Pulmonary shortness of breath and with exertion, neg sleep apnea, COPD,  COPD inhaler, neg recent URI, former smoker,    breath sounds clear to auscultation       Cardiovascular hypertension, Pt. on medications (-) angina+ CAD, + Cardiac Stents and + Peripheral Vascular Disease  (-) Past MI and (-) CHF  Rhythm:Regular     Neuro/Psych negative neurological ROS  negative psych ROS   GI/Hepatic GERD  Medicated and Controlled,  Endo/Other    Renal/GU      Musculoskeletal  (+) Arthritis ,   Abdominal   Peds  Hematology negative hematology ROS (+)   Anesthesia Other Findings   Reproductive/Obstetrics                             Anesthesia Physical Anesthesia Plan  ASA: III  Anesthesia Plan: General   Post-op Pain Management:    Induction: Intravenous  Airway Management Planned: Oral ETT  Additional Equipment: Arterial line  Intra-op Plan:   Post-operative Plan: Extubation in OR  Informed Consent: I have reviewed the patients History and Physical, chart, labs and discussed the procedure including the risks, benefits and alternatives for the proposed anesthesia with the patient or authorized representative who has indicated his/her understanding and acceptance.   Dental advisory given  Plan Discussed with: CRNA and Surgeon  Anesthesia Plan Comments:         Anesthesia Quick Evaluation

## 2015-12-11 NOTE — Anesthesia Procedure Notes (Signed)
Procedure Name: Intubation Performed by: Neldon Newport Pre-anesthesia Checklist: Patient identified, Emergency Drugs available, Suction available, Patient being monitored and Timeout performed Patient Re-evaluated:Patient Re-evaluated prior to inductionOxygen Delivery Method: Circle system utilized Preoxygenation: Pre-oxygenation with 100% oxygen Intubation Type: IV induction Ventilation: Mask ventilation without difficulty Laryngoscope Size: Miller and 2 Grade View: Grade II Tube type: Oral Tube size: 7.0 mm Number of attempts: 2 Placement Confirmation: ETT inserted through vocal cords under direct vision and positive ETCO2 Secured at: 21 cm Tube secured with: Tape Dental Injury: Teeth and Oropharynx as per pre-operative assessment and Injury to lip  Comments: DL with Mac 3 grade x2, subsequent Miller 2 with grade 2 view by Ermalene Postin

## 2015-12-11 NOTE — Progress Notes (Signed)
  Day of Surgery Note    Subjective:  Complaining of headache and neck hurting and being cold  Filed Vitals:   12/11/15 1145 12/11/15 1200  BP:  106/43  Pulse: 69 73  Temp:    Resp: 12 15    Incisions:   Clean and dry  Extremities:  Moving all extremities equally Cardiac:  regular Lungs:  Non labored Neuro:  In tact; tongue is midline  Assessment/Plan:  This is a 82 y.o. female who is s/p left carotid endarterectomy  -pt neurologically in tact -pt seen earlier with headache, which has improved.  BP improved also -to 3 south when bed available   Leontine Locket, PA-C 12/11/2015 12:09 PM

## 2015-12-11 NOTE — Transfer of Care (Signed)
Immediate Anesthesia Transfer of Care Note  Patient: [name]  Procedure(s) Performed: Procedure(s): LEFT CAROTID ENDARTERECTOMY WITH PATCH ANGIOPLASTY (Left)  Patient Location: PACU  Anesthesia Type:General  Level of Consciousness: awake and alert   Airway & Oxygen Therapy: Patient Spontanous Breathing and Patient connected to nasal cannula oxygen  Post-op Assessment: Report given to RN, Post -op Vital signs reviewed and stable, Patient moving all extremities X 4 and Patient able to stick tongue midline  Post vital signs: Reviewed and stable  Last Vitals:  Filed Vitals:   12/11/15 0656  BP: 134/70  Pulse: 66  Temp: 36.9 C  Resp: 20    Last Pain: There were no vitals filed for this visit.       Complications: No apparent anesthesia complications

## 2015-12-11 NOTE — Progress Notes (Signed)
Pt has  Waxing /waning headache, throughout day that dr fields and  s rhyne pa have been aware of. Has been mostly "better" per pt but now is back , "is a solid 5" across forehead. Called to speak with ms rhyne PA in [place] who  Came to bedside to eval pt.... She then spoke with dr Hendricks Limes and will give plain teylenol.

## 2015-12-11 NOTE — Progress Notes (Signed)
Headache is still frontal, "is better and is probably a 4" per pt.," maybe a little bit less".Marland KitchenMarland KitchenNo results found for this or any previous visit. asessment unchanged, intact.

## 2015-12-11 NOTE — H&P (View-Only) (Signed)
Referring Physician: No ref. provider found  Patient name: [name] MRN: [id number] DOB: [birth year] Sex: female  REASON FOR CONSULT: Asymptomatic high-grade left internal carotid artery stenosis  HPI: [name] is a 82 y.o. female with a long history of bilateral carotid occlusive disease. This is been followed in the past with serial ultrasound. She was referred for progression of carotid disease on the left side to greater than 80%. She denies any history of TIA amaurosis or stroke. She does have a history of coronary artery disease and had a left anterior descending stent placed several years ago. She also recently had a workup for syncope at [facility] earlier this year. By history this sounds like she had an echocardiogram and one week of cardiac monitoring. She states that the workup was negative. She denies having a stress test in the last 10 years. She does describe some tightness and heaviness in her chest randomly on occasion at rest or with effort. She also does complain of some occasional pulsatile tinnitus. Her office notes and prior history suggested prior right carotid stenting; however, this was actually coronary artery stenting of her left anterior descending and she has had no prior carotid interventions. Other medical problems include hypertension and COPD. She is on 2 inhalers but rarely uses disease. She does take aspirin and Lipitor. Chronic medical problems include hypertension which has been stable. 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  Past Medical History  Diagnosis Date  . HOH (hard of hearing)     severe hearing loss- Wears Bilateral hearing aids  . Complication of anesthesia     Blood pressure dropped after hammertoe surgery; difficulty waking up  . Hypertension   . Pneumonia     hx of  . Bronchitis     hx of  . COPD (chronic obstructive pulmonary disease) ([place])   . Shortness of  breath     With ambulation at times  . Asthma   . Vertigo     hx of  . Syncope 2009    Has not had another episode since 2009  . Urination frequency     excessive at bedtime  . GERD (gastroesophageal reflux disease)   . Arthritis   . Anemia     hx of. None since hysterectomy  . Hypercholesteremia   . Restless leg syndrome   . Coronary artery disease     prox LAD stent '06  . CAD (coronary artery disease)   . Peripheral vascular disease ([place])   . Carotid artery occlusion   . Enlarged heart    Past Surgical History  Procedure Laterality Date  . Abdominal hysterectomy    . Tonsillectomy    . Cholecystectomy    . Appendectomy    . Back surgery    . Joint replacement  05/2011    Left knee replacement  . Colonoscopy    . Inner ear surgery    . Eye surgery  2012    Bilateral cataract removal  . Foot surgery      Right foot hammer toe surgery  . Anterior lat lumbar fusion  04/06/2012    Procedure: ANTERIOR LATERAL LUMBAR FUSION 1 LEVEL;  Surgeon: Melina Schools, MD;  Location: [facility];  Service: Orthopedics;  Laterality: N/A;  XLIF L3-L4 with Posterior Instrumentation   . Cardiac catheterization    . Coronary angioplasty  2006    Prox LAD  . Excision/release  bursa hip Right 11/23/2012    Procedure: RIGHT HIP BURSECTOMY AND TENDON REPAIR;  Surgeon: Gearlean Alf, MD;  Location: [facility];  Service: Orthopedics;  Laterality: Right;     Family History  Problem Relation Age of Onset  . Heart disease Sister     before age 81  . Heart disease Son     before age 34    SOCIAL HISTORY: Social History   Social History  . Marital Status: Widowed    Spouse Name: N/A  . Number of Children: N/A  . Years of Education: N/A   Occupational History  . Not on file.   Social History Main Topics  . Smoking status: Former Smoker    Types: Cigarettes    Quit date: 11/15/1964  . Smokeless tobacco: Not on file  . Alcohol Use: No  . Drug Use: No  . Sexual Activity: Not Currently    Other Topics Concern  . 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 Norco [Hydrocodone-Acetaminophen] Other (See Comments)    Patient states allergy to Hydrocodone. "Jittery, doesn't work."  . Pineapple Other (See Comments)    Mouth stings and burns  . Sulfa Antibiotics Swelling  . Latex Rash    Current Outpatient Prescriptions  Medication Sig Dispense Refill  . amLODipine (NORVASC) 5 MG tablet Take 5 mg by mouth.    Marland Kitchen aspirin EC 81 MG tablet Take 81 mg by mouth.    Marland Kitchen atorvastatin (LIPITOR) 10 MG tablet Take 10 mg by mouth at bedtime.     Marland Kitchen denosumab (PROLIA) 60 MG/ML SOLN injection Inject 60 mg into the skin.    Marland Kitchen esomeprazole (NEXIUM) 40 MG capsule Take 40 mg by mouth 2 (two) times daily.     . fesoterodine (TOVIAZ) 8 MG TB24 Take 8 mg by mouth daily.    . fluticasone (FLONASE) 50 MCG/ACT nasal spray     . mirtazapine (REMERON) 15 MG tablet Take 15 mg by mouth at bedtime.    . montelukast (SINGULAIR) 10 MG tablet Take 10 mg by mouth at bedtime.    . nitroGLYCERIN (NITROSTAT) 0.4 MG SL tablet Place 0.4 mg under the tongue.    Marland Kitchen Oxycodone HCl 10 MG TABS     . pilocarpine (SALAGEN) 5 MG tablet Take 5 mg by mouth 3 (three) times daily.    . potassium chloride SA (K-DUR,KLOR-CON) 20 MEQ tablet Take 20 mEq by mouth 2 (two) times daily.    Marland Kitchen rOPINIRole (REQUIP) 1 MG tablet Take 1 mg by mouth at bedtime.     . torsemide (DEMADEX) 20 MG tablet Take 20 mg by mouth 2 (two) times daily.    . TRIAMCINOLONE ACETONIDE, TOP, 0.05 % OINT Apply topically.    . Vitamin D, Ergocalciferol, (DRISDOL) 50000 units CAPS capsule Take by mouth.    . zolpidem (AMBIEN) 10 MG tablet Take 10 mg by mouth at bedtime as needed for sleep.     Marland Kitchen albuterol-ipratropium (COMBIVENT) 18-103 MCG/ACT inhaler Inhale 2 puffs into the lungs 3 (three) times daily as needed for shortness of breath. Reported on 11/15/2015     . albuterol-ipratropium (COMBIVENT) 18-103 MCG/ACT inhaler Inhale into the lungs. Reported on 11/15/2015    . fish oil-omega-3 fatty acids 1000 MG capsule Take 1 g by mouth 2 (two) times daily. Reported on 11/15/2015    . methocarbamol (ROBAXIN) 500 MG  tablet Take 1 tablet (500 mg total) by mouth every 6 (six) hours as needed. (Patient not taking: Reported on 11/15/2015) 80 tablet 0  . oxyCODONE (OXY IR/ROXICODONE) 5 MG immediate release tablet Take 1 tablet (5 mg total) by mouth every 6 (six) hours as needed for pain. (Patient not taking: Reported on 11/15/2015) 80 tablet 0  . polyethylene glycol (MIRALAX / GLYCOLAX) packet Take 17 g by mouth daily as needed (constipation). Reported on 11/15/2015     No current facility-administered medications for this visit.    ROS:   General:  No weight loss, Fever, chills  HEENT: No recent headaches, no nasal bleeding, no visual changes, no sore throat  Neurologic: No dizziness, blackouts, seizures. No recent symptoms of stroke or mini- stroke. No recent episodes of slurred speech, or temporary blindness.  Cardiac: No recent episodes of chest pain/pressure, no shortness of breath at rest.  No shortness of breath with exertion.  Denies history of atrial fibrillation or irregular heartbeat  Vascular: No history of rest pain in feet.  No history of claudication.  No history of non-healing ulcer, No history of DVT   Pulmonary: No home oxygen, no productive cough, no hemoptysis,  No asthma or wheezing  Musculoskeletal:  [ ]  Arthritis, [ ]  Low back pain,  [ ]  Joint pain  Hematologic:No history of hypercoagulable state.  No history of easy bleeding.  No history of anemia  Gastrointestinal: No hematochezia or melena,  No gastroesophageal reflux, no trouble swallowing  Urinary: [ ]  chronic Kidney disease, [ ]  on HD - [ ]  MWF or [ ]  TTHS, [ ]  Burning with urination, [ ]  Frequent urination, [ ]  Difficulty urinating;   Skin: No rashes  Psychological: No history  of anxiety,  No history of depression   Physical Examination  Filed Vitals:   11/15/15 0925 11/15/15 0927  BP: 152/68 153/69  Pulse: 60   Height: 5' (1.524 m)   Weight: 150 lb 3.2 oz (68.13 kg)   SpO2: 97%     Body mass index is 29.33 kg/(m^2).  General:  Alert and oriented, no acute distress HEENT: Normal Neck: Bilateral carotid bruit Pulmonary: Clear to auscultation bilaterally Cardiac: Regular Rate and Rhythm without 2/6 systolic murmur heard best right second interspace Abdomen: Soft, non-tender, non-distended, no mass Skin: No rash Extremity Pulses:  2+ radial, brachial, femoral, absent dorsalis pedis, 2+ right 1+ left posterior tibial pulses Musculoskeletal: No deformity or edema  Neurologic: Upper and lower extremity motor 5/5 and symmetric  DATA: Patient had a carotid duplex scan today which shows greater than 80% stenosis on the left side with velocities of 393/123. She had a 40-60% right internal carotid artery stenosis. Vertebral arteries were antegrade.  ASSESSMENT:  82 year old female with asymptomatic left internal carotid artery stenosis. She currently is on maximal medical management with aspirin and a statin. I discussed with her carotid endarterectomy for stroke prophylaxis. I also discussed with her the risk of stroke overall is low about 5% per year. I discussed with her that stroke risk with carotid endarterectomy is about 1-2% perioperative period also other risks of cardiac events 5% cranial nerve injury risk 10-15%. Also with a symptomatically carotid endarterectomy to achieve full benefit she would need to live at least 5 years. 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 Overall risk of transfusion for carotid endarterectomy is low. However we will make sure we check a baseline hemoglobin to make sure that she would not be  at risk for requiring transfusion. Since she is greater than 39 years old we will make sure that all of the  medical systems are completely optimized including cardiac risk stratification. If she is deemed to be low risk overall we will proceed with left carotid endarterectomy. If she is moderate or high risk we will continue medical management.  PLAN:  See above   Ruta Hinds, MD [facility] [place] Office: [phone] Pager: [phone]

## 2015-12-11 NOTE — Progress Notes (Signed)
Called to PACU to see pt as she continues to have a persistent headache.  She states it has gradually worsened and rates it as a 5-6 on a scale of 1-10.   She is neurologically in tact.  Her systolic blood pressure is 130.  She is eating and drinking without difficulty.   She has received OxyIR, Fentanyl 190mcg, and Morphine 5mg .   I discussed this with Dr. Oneida Alar and he wants to d/c IV pain medication, give Tylenol and if her headache worsens, a non contrast head CT scan can be ordered.    Leontine Locket 12/11/2015 4:34 PM

## 2015-12-11 NOTE — Op Note (Signed)
Procedure Left carotid endarterectomy  Preoperative diagnosis: High-grade asymptomatic left internal carotid artery stenosis  Postoperative diagnosis: Same  Anesthesia General  Asst.: Silva Bandy, [facility]  Operative findings: #1 greater than 80% left internal carotid stenosis                                                             #2 Dacron patch           #3 10 Fr shunt                                #4 low carotid bifurcation  Operative details: After obtaining informed consent, the patient was taken to the operating room. The patient was placed in a supine position on the operating room table. After induction of general anesthesia and endotracheal intubation, the patient's entire neck and chest was prepped and draped in the usual sterile fashion. An oblique incision was made on the left aspect of the patient's neck anterior to the border the left sternocleidomastoid muscle. The incision was carried into the subcutaneous tissues and through the platysma. The sternocleidomastoid muscle was identified and reflected laterally. The omohyoid muscle was identified and this was divided with cautery. The common carotid artery was then found at the base of the incision this was dissected free circumferentially. It was fairly soft on palpation.  The carotid bifurcation was fairly low just above the omohyoid.  The vagus nerve was identified and protected. Dissection was then carried up to the level carotid bifurcation.   The hyperglossal nerve was well above the primary area of dissection. The internal carotid artery was dissected free circumferentially just below the level of the hypoglossal nerve and it was soft in character at this location and above any palpable disease. A vessel loop was placed around this. Next the external carotid and superior thyroid arteries were dissected free circumferentially and vessel loops were placed around these. The patient was given 7000 units of intravenous heparin.  After 2  minutes of circulation time and raising the mean arterial pressure to 90 mm mercury, the distal internal carotid artery was controlled with small bulldog clamp. The external carotid and superior thyroid arteries were controlled with vessel loops. The common carotid artery was controlled with a peripheral DeBakey clamp. A longitudinal opening was made in the common carotid artery just below the bifurcation. The arteriotomy was extended distally up into the internal carotid with Potts scissors. There was a large calcified plaque with greater than 80% stenosis in the internal carotid.  An 10 Fr shunt was brought onto the field and fashioned to fit the patient's artery.  This was threaded into the distal internal carotid artery and allowed to backbleed thoroughly.  There was pulsatile backbleeding.  This was then threaded into the common carotid and secured with a Rummel tourniquet.    Attention was then turned to the common carotid artery once again. A suitable endarterectomy plane was obtained and endarterectomy was begun in the common carotid artery and a good proximal endpoint was obtained. An eversion endarterectomy was performed on the external carotid artery and a good endpoint was obtained. The plaque was then elevated in the internal carotid artery and a nice feathered distal endpoint was also obtained.  The plaque was passed off the table. All loose debris was then removed from the carotid bed and everything was thoroughly irrigated with heparinized saline. A Dacron patch was then brought on to the operative field and this was sewn on as a patch angioplasty using a running 6-0 Prolene suture. Prior to completion of the anastomosis the internal carotid artery was thoroughly backbled. This was then controlled again with a fine bulldog clamp.  The common carotid was thoroughly flushed forward. The external carotid was also thoroughly backbled.  The remainder of the patch was completed and the anastomosis was  secured. Flow was then restored first retrograde from the external carotid into the carotid bed then antegrade from the common carotid to the external carotid artery and after approximately 5 cardiac cycles to the internal carotid artery. Doppler was used to evaluate the external/internal and common carotid arteries and these all had good Doppler flow. Hemostasis was obtained with 1 additional repair suture. The patient was also given 70 mg of Protamine.      The platysma muscle was reapproximated using a running 3-0 Vicryl suture. The skin was closed with 4 0 Vicryl subcuticular stitch.  The patient was awakened in the operating room and was moving upper and lower extremities symmetrically and following commands.  The patient was stable on arrival to the PACU.  Ruta Hinds, MD [facility] [place] Office: [phone] Pager: [phone]

## 2015-12-12 ENCOUNTER — Encounter (HOSPITAL_COMMUNITY): Payer: Self-pay | Admitting: Vascular Surgery

## 2015-12-12 ENCOUNTER — Telehealth: Payer: Self-pay | Admitting: Vascular Surgery

## 2015-12-12 LAB — BASIC METABOLIC PANEL
ANION GAP: 10 (ref 5–15)
BUN: 10 mg/dL (ref 6–20)
CALCIUM: 7.9 mg/dL — AB (ref 8.9–10.3)
CO2: 26 mmol/L (ref 22–32)
Chloride: 104 mmol/L (ref 101–111)
Creatinine, Ser: 1.13 mg/dL — ABNORMAL HIGH (ref 0.44–1.00)
GFR, EST AFRICAN AMERICAN: 51 mL/min — AB (ref >60)
GFR, EST NON AFRICAN AMERICAN: 44 mL/min — AB (ref >60)
Glucose, Bld: 124 mg/dL — ABNORMAL HIGH (ref 65–99)
POTASSIUM: 3.7 mmol/L (ref 3.5–5.1)
SODIUM: 140 mmol/L (ref 135–145)

## 2015-12-12 LAB — CBC
HCT: 31.9 % — ABNORMAL LOW (ref 36.0–46.0)
Hemoglobin: 10.1 g/dL — ABNORMAL LOW (ref 12.0–15.0)
MCH: 27.7 pg (ref 26.0–34.0)
MCHC: 31.7 g/dL (ref 30.0–36.0)
MCV: 87.4 fL (ref 78.0–100.0)
PLATELETS: 185 10*3/uL (ref 150–400)
RBC: 3.65 MIL/uL — ABNORMAL LOW (ref 3.87–5.11)
RDW: 13.7 % (ref 11.5–15.5)
WBC: 7.2 10*3/uL (ref 4.0–10.5)

## 2015-12-12 NOTE — Discharge Summary (Signed)
[facility] Discharge Summary  [name] February 02, 1930 82 y.o. female  [id number]  Admission Date: 12/11/2015  Discharge Date: 12/12/2015  Physician: Ruta Hinds, MD  Admission Diagnosis: Left carotid artery stenosis I65.22  HPI:   This is a 82 y.o. female with a long history of bilateral carotid occlusive disease. This is been followed in the past with serial ultrasound. She was referred for progression of carotid disease on the left side to greater than 80%. She denies any history of TIA amaurosis or stroke. She does have a history of coronary artery disease and had a left anterior descending stent placed several years ago. She also recently had a workup for syncope at [facility] earlier this year. By history this sounds like she had an echocardiogram and one week of cardiac monitoring. She states that the workup was negative. She denies having a stress test in the last 10 years. She does describe some tightness and heaviness in her chest randomly on occasion at rest or with effort. She also does complain of some occasional pulsatile tinnitus. Her office notes and prior history suggested prior right carotid stenting; however, this was actually coronary artery stenting of her left anterior descending and she has had no prior carotid interventions. Other medical problems include hypertension and COPD. She is on 2 inhalers but rarely uses disease. She does take aspirin and Lipitor. Chronic medical problems include hypertension which has been stable. 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  Hospital Course:  The patient was admitted to the hospital and taken to the operating room on 12/11/2015 and underwent left carotid endarterectomy.  The patient tolerated the procedure well and was transported to the PACU in stable condition.  Post-operatively, the patient complained of headache. She had OxyIR,  Fentanyl 125mg, and Morphine 515m   Her IV pain medications were discontinued. She was advised to take Tylenol for her pain.  By POD 1, the patient's neuro status was intact. Her headache had resolved. Her left neck incision was clean without  Hematoma. She was ambulating and tolerating a diet without difficulty. She was discharged home on postop day 1 in good condition.    Recent Labs  12/12/15 0340  NA 140  K 3.7  CL 104  CO2 26  GLUCOSE 124*  BUN 10  CALCIUM 7.9*    Recent Labs  12/11/15 2234 12/12/15 0340  WBC 7.5 7.2  HGB 10.3* 10.1*  HCT 33.5* 31.9*  PLT 176 185   No results for input(s): INR in the last 72 hours.  Discharge Instructions:   The patient is discharged to home with extensive instructions on wound care and progressive ambulation.  They are instructed not to drive or perform any heavy lifting until returning to see the physician in his office.  Discharge Instructions    Call MD for:  redness, tenderness, or signs of infection (pain, swelling, bleeding, redness, odor or green/yellow discharge around incision site)    Complete by:  As directed      Call MD for:  severe or increased pain, loss or decreased feeling  in affected limb(s)    Complete by:  As directed      Call MD for:  temperature >100.5    Complete by:  As directed      Discharge wound care:    Complete by:  As directed   Wash wound daily with soap and water and pat dry. Do  not apply any creams or ointments on your incisions.     Driving Restrictions    Complete by:  As directed   No driving for 2 weeks     Increase activity slowly    Complete by:  As directed   Walk with assistance use walker or cane as needed     Lifting restrictions    Complete by:  As directed   No lifting for 2 weeks     Resume previous diet    Complete by:  As directed            Discharge Diagnosis:  Left carotid artery stenosis I65.22  Secondary Diagnosis: Patient Active Problem List   Diagnosis Date  Noted  . Asymptomatic carotid artery stenosis 12/11/2015  . Bursitis of left hip 11/23/2012   Past Medical History  Diagnosis Date  . HOH (hard of hearing)     severe hearing loss- Wears Bilateral hearing aids  . Complication of anesthesia     Blood pressure dropped after hammertoe surgery; difficulty waking up  . Hypertension   . Pneumonia     hx of  . Bronchitis     hx of  . COPD (chronic obstructive pulmonary disease) ([place])   . Shortness of breath     With ambulation at times  . Asthma   . Vertigo     hx of  . Syncope 2009    Has not had another episode since 2009  . Urination frequency     excessive at bedtime  . GERD (gastroesophageal reflux disease)   . Arthritis   . Anemia     hx of. None since hysterectomy  . Hypercholesteremia   . Restless leg syndrome   . Coronary artery disease     prox LAD stent '06  . CAD (coronary artery disease)   . Peripheral vascular disease ([place])   . Carotid artery occlusion   . Enlarged heart   . Cancer ([place])     skin cancer on right hand      Medication List    TAKE these medications        albuterol-ipratropium 18-103 MCG/ACT inhaler  Commonly known as:  COMBIVENT  Inhale 2 puffs into the lungs every 4 (four) hours as needed for shortness of breath. Reported on 11/15/2015     amLODipine 5 MG tablet  Commonly known as:  NORVASC  Take 5 mg by mouth.     aspirin EC 81 MG tablet  Take 81 mg by mouth.     atorvastatin 10 MG tablet  Commonly known as:  LIPITOR  Take 10 mg by mouth at bedtime.     Biotin 10 MG Tabs  Take 1 tablet by mouth daily.     Cyanocobalamin 1000 MCG/ML Kit  Inject 2 mLs as directed every 30 (thirty) days.     denosumab 60 MG/ML Soln injection  Commonly known as:  PROLIA  Inject 60 mg into the skin every 6 (six) months.     esomeprazole 40 MG capsule  Commonly known as:  NEXIUM  Take 40 mg by mouth 2 (two) times daily.     fluticasone 50 MCG/ACT nasal spray  Commonly known as:  FLONASE    Place 1 spray into both nostrils daily.     mirtazapine 15 MG tablet  Commonly known as:  REMERON  Take 15 mg by mouth at bedtime.     montelukast 10 MG tablet  Commonly known as:  SINGULAIR  Take  10 mg by mouth at bedtime.     NITROSTAT 0.4 MG SL tablet  Generic drug:  nitroGLYCERIN  Place 0.4 mg under the tongue every 5 (five) minutes as needed for chest pain.     Oxycodone HCl 10 MG Tabs  Take 1 tablet (10 mg total) by mouth every 4 (four) hours as needed.     polyethylene glycol packet  Commonly known as:  MIRALAX / GLYCOLAX  Take 17 g by mouth daily as needed (constipation). Reported on 11/15/2015     potassium chloride SA 20 MEQ tablet  Commonly known as:  K-DUR,KLOR-CON  Take 20 mEq by mouth 2 (two) times daily.     rOPINIRole 1 MG tablet  Commonly known as:  REQUIP  Take 2 mg by mouth at bedtime.     torsemide 20 MG tablet  Commonly known as:  DEMADEX  Take 20 mg by mouth 2 (two) times daily.     TOVIAZ 8 MG Tb24 tablet  Generic drug:  fesoterodine  Take 8 mg by mouth daily.     Vitamin D (Ergocalciferol) 50000 units Caps capsule  Commonly known as:  DRISDOL  Take 50,000 Units by mouth every 7 (seven) days. Sunday     zolpidem 10 MG tablet  Commonly known as:  AMBIEN  Take 10 mg by mouth at bedtime.        Oxycodone #15 No Refill  Disposition: HomeGood  Patient's condition: is Good  Follow up: 1. Dr.  Oneida Alar in 2 weeks.   Virgina Jock, PA-C [facility] [phone]  --- For [facility] use --- Instructions: Press F2 to tab through selections.  Delete question if not applicable.   Modified Rankin score at D/C (0-6): 0  IV medication needed for:  1. Hypertension: No 2. Hypotension: No  Post-op Complications: No  1. Post-op CVA or TIA: No  2. CN injury: No  3. Myocardial infarction: No  4.  CHF: No  5.  Dysrhythmia (new): No  6. Wound infection: No  7. Reperfusion symptoms: No  8. Return to OR:  No  Discharge medications: Statin use:  Yes If No: [ ]  For Medical reasons, [ ]  Non-compliant, [ ]  Not-indicated ASA use:  Yes  If No: [ ]  For Medical reasons, [ ]  Non-compliant, [ ]  Not-indicated Beta blocker use:  No If No: [ ]  For Medical reasons, [ ]  Non-compliant, [x ] Not-indicated ACE-Inhibitor use:  No If No: [ ]  For Medical reasons, [ ]  Non-compliant, [x ] Not-indicated P2Y12 Antagonist use: No, [ ]  Plavix, [ ]  Plasugrel, [ ]  Ticlopinine, [ ]  Ticagrelor, [ ]  Other, [ ]  No for medical reason, [ ]  Non-compliant, [x ] Not-indicated Anti-coagulant use:  No, [ ]  Warfarin, [ ]  Rivaroxaban, [ ]  Dabigatran, [ ]  Other, [ ]  No for medical reason, [ ]  Non-compliant, [ x] Not-indicated

## 2015-12-12 NOTE — Telephone Encounter (Signed)
sched appt 5/10 at 8:30. Spoke to "Vicente Males" to inform pt of appt.

## 2015-12-12 NOTE — Progress Notes (Addendum)
  [facility] Progress Note  Subjective  - POD #1  Headache is better. Now a "2 out of 10."  Objective Filed Vitals:   12/11/15 2344 12/12/15 0350  BP: 121/41 129/53  Pulse: 66 74  Temp: 98.5 F (36.9 C) 98.9 F (37.2 C)  Resp: 12 14    Intake/Output Summary (Last 24 hours) at 12/12/15 0726 Last data filed at 12/12/15 0600  Gross per 24 hour  Intake   2665 ml  Output    400 ml  Net   2265 ml    Left neck incision without hematoma Equal strength upper and lower extremities. Tongue midline. Smile symmetric.   Assessment/Planning: 82 y.o. female is s/p: left carotid endarterectomy 1 Day Post-Op   Neuro exam intact. No hematoma left neck.  Headache improved this am with tylenol. Needs to ambulate in halls.  Home today.  Modified rankin: 0 VQI: on ASA and statin  Alvia Grove 12/12/2015 7:26 AM --  Headache resolved.  No hematoma.  Neuro UE/LE 5/5 no facial asymmetry, swallow intact D/c home  Ruta Hinds, MD [facility] [place] Office: [phone] Pager: [phone]   Laboratory CBC    Component Value Date/Time   WBC 7.2 12/12/2015 0340   HGB 10.1* 12/12/2015 0340   HCT 31.9* 12/12/2015 0340   PLT 185 12/12/2015 0340    BMET    Component Value Date/Time   NA 140 12/12/2015 0340   K 3.7 12/12/2015 0340   CL 104 12/12/2015 0340   CO2 26 12/12/2015 0340   GLUCOSE 124* 12/12/2015 0340   BUN 10 12/12/2015 0340   CREATININE 1.13* 12/12/2015 0340   CALCIUM 7.9* 12/12/2015 0340   GFRNONAA 44* 12/12/2015 0340   GFRAA 51* 12/12/2015 0340    COAG Lab Results  Component Value Date   INR 1.02 12/05/2015   INR 1.00 03/28/2012   INR 1.05 04/29/2011   No results found for: PTT  Antibiotics Anti-infectives    Start     Dose/Rate Route Frequency Ordered Stop   12/11/15 2230  cefUROXime (ZINACEF) 1.5 g in dextrose 5 % 50 mL IVPB     1.5 g 100 mL/hr over 30 Minutes Intravenous Every 12 hours 12/11/15  2013 12/12/15 2229   12/11/15 0642  cefUROXime (ZINACEF) 1.5 g in dextrose 5 % 50 mL IVPB     1.5 g 100 mL/hr over 30 Minutes Intravenous 30 min pre-op 12/11/15 0642 12/11/15 [place], PA-C [facility] Office: [phone] Pager: [phone] 12/12/2015 7:26 AM

## 2015-12-12 NOTE — Care Management Note (Signed)
Case Management Note  Patient Details  Name: [name] MRN: [id number] Date of Birth: 1929-11-25  Subjective/Objective:    Patient is from home, s/p CEA, for dc today, no needs.               Action/Plan:   Expected Discharge Date:  12/12/15               Expected Discharge Plan:  Home/Self Care  In-House Referral:     Discharge planning Services  CM Consult  Post Acute Care Choice:    Choice offered to:     DME Arranged:    DME Agency:     HH Arranged:    HH Agency:     Status of Service:  Completed, signed off  Medicare Important Message Given:    Date Medicare IM Given:    Medicare IM give by:    Date Additional Medicare IM Given:    Additional Medicare Important Message give by:     If discussed at [place] of Stay Meetings, dates discussed:    Additional Comments:  Zenon Mayo, RN 12/12/2015, 11:27 AM

## 2015-12-12 NOTE — Progress Notes (Signed)
Discussed discharge instructions and medications with patient and Family. Both verbalized understanding with all questions answered. VSS. Pt discharged home with Family.  [facility]

## 2015-12-12 NOTE — Telephone Encounter (Signed)
-----   Message from Mena Goes, RN sent at 12/11/2015 10:53 AM EDT ----- Regarding: schedule   ----- Message -----    From: Alvia Grove, PA-C    Sent: 12/11/2015  10:42 AM      To: Vvs Charge Pool  S/p left CEA 12/11/15  F/u with CEF in 2 weeks  Thanks Maudie Mercury

## 2015-12-13 ENCOUNTER — Encounter: Payer: Self-pay | Admitting: Vascular Surgery

## 2015-12-13 NOTE — Anesthesia Postprocedure Evaluation (Signed)
Anesthesia Post Note  Patient: [name]  Procedure(s) Performed: Procedure(s) (LRB): LEFT CAROTID ENDARTERECTOMY WITH PATCH ANGIOPLASTY (Left)  Patient location during evaluation: PACU Anesthesia Type: General Level of consciousness: awake Pain management: pain level controlled Vital Signs Assessment: post-procedure vital signs reviewed and stable Respiratory status: spontaneous breathing Cardiovascular status: stable Postop Assessment: no signs of nausea or vomiting Anesthetic complications: no    Last Vitals:  Filed Vitals:   12/12/15 0350 12/12/15 0729  BP: 129/53 103/41  Pulse: 74 71  Temp: 37.2 C 36.9 C  Resp: 14 14    Last Pain:  Filed Vitals:   12/12/15 0839  PainSc: 2                  Mecca Barga

## 2015-12-18 ENCOUNTER — Encounter: Payer: Medicare Other | Admitting: Vascular Surgery

## 2016-01-07 ENCOUNTER — Encounter: Payer: Self-pay | Admitting: Vascular Surgery

## 2016-01-09 ENCOUNTER — Encounter: Payer: Self-pay | Admitting: Vascular Surgery

## 2016-01-09 ENCOUNTER — Ambulatory Visit (INDEPENDENT_AMBULATORY_CARE_PROVIDER_SITE_OTHER): Payer: Medicare Other | Admitting: Vascular Surgery

## 2016-01-09 VITALS — BP 135/68 | HR 58 | Temp 97.6°F | Ht 60.0 in | Wt 154.0 lb

## 2016-01-09 DIAGNOSIS — I6523 Occlusion and stenosis of bilateral carotid arteries: Secondary | ICD-10-CM

## 2016-01-09 NOTE — Progress Notes (Signed)
This is an 82 year old female who returns for postoperative follow-up today after left carotid endarterectomy 12/11/2015. She reports no symptoms of TIA amaurosis or stroke. She does still have a little bit of difficulty with swallowing especially things like bread. She states however she cannot really tell if this is much worse than she was preoperatively.  Physical exam:  Filed Vitals:   01/09/16 1520 01/09/16 1521  BP: 144/71 135/68  Pulse: 58   Temp: 97.6 F (36.4 C)   TempSrc: Oral   Height: 5' (1.524 m)   Weight: 154 lb (69.854 kg)   SpO2: 96%     Neck: No carotid bruits well-healed left neck incision  Neuro: Symmetric upper and lower extremity motor strength 5 over 5 no facial asymmetrytongue midline  Assessment: Doing well status post left carotid endarterectomy. Patient will follow-up in 9 months time with repeat carotid duplex exam and continue to take her aspirin in the meanwhile. She will try to chew her food completely and swallow slowly until the swallowing mechanism overall improves.  Plan: See above  Ruta Hinds, MD [facility] [place] Office: [phone] Pager: [phone]

## 2016-04-22 DIAGNOSIS — Z79899 Other long term (current) drug therapy: Secondary | ICD-10-CM | POA: Insufficient documentation

## 2016-07-22 DIAGNOSIS — J4520 Mild intermittent asthma, uncomplicated: Secondary | ICD-10-CM | POA: Insufficient documentation

## 2016-09-28 ENCOUNTER — Other Ambulatory Visit: Payer: Self-pay | Admitting: *Deleted

## 2016-09-28 DIAGNOSIS — Z48812 Encounter for surgical aftercare following surgery on the circulatory system: Secondary | ICD-10-CM

## 2016-09-28 DIAGNOSIS — I6523 Occlusion and stenosis of bilateral carotid arteries: Secondary | ICD-10-CM

## 2016-10-15 ENCOUNTER — Encounter (HOSPITAL_COMMUNITY): Payer: Medicare Other

## 2016-10-15 ENCOUNTER — Ambulatory Visit: Payer: Medicare Other | Admitting: Family

## 2016-11-12 ENCOUNTER — Encounter (HOSPITAL_COMMUNITY): Payer: Medicare Other

## 2016-11-12 ENCOUNTER — Encounter: Payer: Medicare Other | Admitting: Vascular Surgery

## 2016-12-01 DIAGNOSIS — I350 Nonrheumatic aortic (valve) stenosis: Secondary | ICD-10-CM | POA: Insufficient documentation

## 2016-12-01 DIAGNOSIS — I517 Cardiomegaly: Secondary | ICD-10-CM | POA: Insufficient documentation

## 2016-12-01 DIAGNOSIS — I5032 Chronic diastolic (congestive) heart failure: Secondary | ICD-10-CM | POA: Insufficient documentation

## 2016-12-08 ENCOUNTER — Encounter: Payer: Self-pay | Admitting: Vascular Surgery

## 2016-12-17 ENCOUNTER — Ambulatory Visit (INDEPENDENT_AMBULATORY_CARE_PROVIDER_SITE_OTHER): Payer: Medicare Other | Admitting: Vascular Surgery

## 2016-12-17 ENCOUNTER — Encounter: Payer: Self-pay | Admitting: Vascular Surgery

## 2016-12-17 ENCOUNTER — Ambulatory Visit (HOSPITAL_COMMUNITY)
Admission: RE | Admit: 2016-12-17 | Discharge: 2016-12-17 | Disposition: A | Discharge status: Home / Self Care | Payer: Medicare Other | Source: Ambulatory Visit | Attending: Vascular Surgery | Admitting: Vascular Surgery

## 2016-12-17 VITALS — BP 158/75 | HR 65 | Temp 97.5°F | Resp 18 | Ht 60.0 in | Wt 152.4 lb

## 2016-12-17 DIAGNOSIS — I739 Peripheral vascular disease, unspecified: Secondary | ICD-10-CM | POA: Diagnosis not present

## 2016-12-17 DIAGNOSIS — I6523 Occlusion and stenosis of bilateral carotid arteries: Secondary | ICD-10-CM | POA: Diagnosis present

## 2016-12-17 DIAGNOSIS — I6521 Occlusion and stenosis of right carotid artery: Secondary | ICD-10-CM | POA: Insufficient documentation

## 2016-12-17 DIAGNOSIS — Z48812 Encounter for surgical aftercare following surgery on the circulatory system: Secondary | ICD-10-CM | POA: Diagnosis not present

## 2016-12-17 LAB — VAS US CAROTID
LCCADSYS: 73 cm/s
LCCAPDIAS: 21 cm/s
LEFT ECA DIAS: 13 cm/s
LEFT VERTEBRAL DIAS: 11 cm/s
LICADDIAS: -23 cm/s
LICADSYS: -110 cm/s
LICAPDIAS: 22 cm/s
LICAPSYS: 117 cm/s
Left CCA dist dias: 16 cm/s
Left CCA prox sys: 116 cm/s
RCCAPDIAS: 18 cm/s
RIGHT CCA MID DIAS: 13 cm/s
Right CCA prox sys: 95 cm/s
Right cca dist sys: 134 cm/s

## 2016-12-17 NOTE — Progress Notes (Signed)
Patient is an 83 year old female who returns for follow-up today. She underwent left carotid endarterectomy May 2017. She still is having some swallowing difficulties but this may be slightly improved. She denies any new symptoms of TIA amaurosis or stroke. She states that her swallowing symptoms are similar to preop. She also complains of intermittent dizziness. This will last for a few seconds and happens about 8-10 times per day. It does not seem to be associated with position. She is on aspirin and a statin daily.  Additionally, the patient complains of a cool sensation in her feet that has been present for the last 2-3 years. She denies any claudication symptoms. She states that the left leg to get worse after a hip operation 3 years ago. She recently had a venous duplex ultrasound to [facility] radiology on February 2018 which showed no DVT. She also had an arterial duplex scan which showed minimal superficial femoral artery occlusive disease but normal ABIs. This was also in February.  Review of systems: She develops occasional leg swelling. She has dyspnea with exertion.  Current Outpatient Prescriptions on File Prior to Visit  Medication Sig Dispense Refill  . albuterol-ipratropium (COMBIVENT) 18-103 MCG/ACT inhaler Inhale 2 puffs into the lungs every 4 (four) hours as needed for shortness of breath. Reported on 11/15/2015    . atorvastatin (LIPITOR) 10 MG tablet Take 10 mg by mouth at bedtime.     . Biotin 10 MG TABS Take 1 tablet by mouth daily.    . Cyanocobalamin 1000 MCG/ML KIT Inject 2 mLs as directed every 30 (thirty) days.    Marland Kitchen denosumab (PROLIA) 60 MG/ML SOLN injection Inject 60 mg into the skin every 6 (six) months.     . esomeprazole (NEXIUM) 40 MG capsule Take 40 mg by mouth 2 (two) times daily.     . fesoterodine (TOVIAZ) 8 MG TB24 Take 8 mg by mouth daily.    . mirtazapine (REMERON) 15 MG tablet Take 15 mg by mouth at bedtime.    . montelukast (SINGULAIR) 10 MG tablet Take 10 mg  by mouth at bedtime.    . nitroGLYCERIN (NITROSTAT) 0.4 MG SL tablet Place 0.4 mg under the tongue every 5 (five) minutes as needed for chest pain.     . Oxycodone HCl 10 MG TABS Take 1 tablet (10 mg total) by mouth every 4 (four) hours as needed. 15 tablet 0  . polyethylene glycol (MIRALAX / GLYCOLAX) packet Take 17 g by mouth daily as needed (constipation). Reported on 11/15/2015    . potassium chloride SA (K-DUR,KLOR-CON) 20 MEQ tablet Take 20 mEq by mouth 2 (two) times daily.    Marland Kitchen rOPINIRole (REQUIP) 1 MG tablet Take 2 mg by mouth at bedtime.     . torsemide (DEMADEX) 20 MG tablet Take 20 mg by mouth 2 (two) times daily.    . Vitamin D, Ergocalciferol, (DRISDOL) 50000 units CAPS capsule Take 50,000 Units by mouth every 7 (seven) days. Sunday    . zolpidem (AMBIEN) 10 MG tablet Take 10 mg by mouth at bedtime.     Marland Kitchen amLODipine (NORVASC) 5 MG tablet Take 5 mg by mouth.    . fluticasone (FLONASE) 50 MCG/ACT nasal spray Place 1 spray into both nostrils daily.      No current facility-administered medications on file prior to visit.      Physical exam:  Vitals:   12/17/16 0903 12/17/16 0906  BP: (!) 148/74 (!) 158/75  Pulse: 65   Resp: 18  Temp: 97.5 F (36.4 C)   TempSrc: Oral   SpO2: 96%   Weight: 152 lb 6.4 oz (69.1 kg)   Height: 5' (1.524 m)     Extremities: 2+ femoral pulses bilaterally, 2+ left dorsalis pedis pulse, absent right pedal pulses  Skin: No open ulcers or rash  Neck: 2+ carotid pulses bilaterally no bruit  Chest: Clear to auscultation bilaterally  Cardiac: Regular rate and rhythm  Data: Patient had a carotid duplex scan today which showed 60-80% right internal carotid artery stenosis widely patent left side  Assessment: #1 dizziness I reassured the patient the dizziness is not usually related to carotid disease. Dizziness symptoms related carotid occlusive disease usually requires high-grade stenosis of both vertebrals and carotid arteries. She has a moderate  right internal carotid artery stenosis. More likely her dizziness is related to postural changes versus orthostasis versus cardiac arrhythmia versus inner ear.  I discussed with her possible workup of any of these but she will leave this at the discretion of her primary care physician and prefers not any further workup unless necessary.  #2 carotid occlusive disease patient needs a follow-up carotid duplex scan in 1 year.  She will see our nurse practitioner at that office visit.  #3 cool left foot most likely this is more related to neuropathy positional changes. She has a palpable left dorsalis pedis pulse with normal ABIs on that side. I do not believe this needs any further workup from a vascular standpoint.  Ruta Hinds, MD [facility] [place] Office: [phone] Pager: [phone]

## 2017-12-07 ENCOUNTER — Other Ambulatory Visit: Payer: Self-pay

## 2017-12-07 DIAGNOSIS — I6523 Occlusion and stenosis of bilateral carotid arteries: Secondary | ICD-10-CM

## 2017-12-23 ENCOUNTER — Ambulatory Visit: Payer: Medicare Other | Admitting: Family

## 2017-12-23 ENCOUNTER — Encounter (HOSPITAL_COMMUNITY): Payer: Medicare Other

## 2018-01-06 ENCOUNTER — Inpatient Hospital Stay (HOSPITAL_COMMUNITY): Admission: RE | Admit: 2018-01-06 | Payer: Medicare Other | Source: Ambulatory Visit

## 2018-01-06 ENCOUNTER — Ambulatory Visit: Payer: Medicare Other | Admitting: Family

## 2018-02-03 ENCOUNTER — Ambulatory Visit (INDEPENDENT_AMBULATORY_CARE_PROVIDER_SITE_OTHER): Payer: Medicare Other | Admitting: Family

## 2018-02-03 ENCOUNTER — Encounter: Payer: Self-pay | Admitting: Family

## 2018-02-03 ENCOUNTER — Ambulatory Visit (HOSPITAL_COMMUNITY)
Admission: RE | Admit: 2018-02-03 | Discharge: 2018-02-03 | Disposition: A | Discharge status: Home / Self Care | Payer: Medicare Other | Source: Ambulatory Visit | Attending: Vascular Surgery | Admitting: Vascular Surgery

## 2018-02-03 VITALS — BP 143/67 | HR 67 | Temp 98.2°F | Ht 60.0 in | Wt 137.0 lb

## 2018-02-03 DIAGNOSIS — I499 Cardiac arrhythmia, unspecified: Secondary | ICD-10-CM | POA: Diagnosis not present

## 2018-02-03 DIAGNOSIS — Z9889 Other specified postprocedural states: Secondary | ICD-10-CM

## 2018-02-03 DIAGNOSIS — I6523 Occlusion and stenosis of bilateral carotid arteries: Secondary | ICD-10-CM | POA: Diagnosis present

## 2018-02-03 DIAGNOSIS — I10 Essential (primary) hypertension: Secondary | ICD-10-CM | POA: Diagnosis not present

## 2018-02-03 DIAGNOSIS — I6521 Occlusion and stenosis of right carotid artery: Secondary | ICD-10-CM | POA: Insufficient documentation

## 2018-02-03 DIAGNOSIS — E785 Hyperlipidemia, unspecified: Secondary | ICD-10-CM | POA: Diagnosis not present

## 2018-02-03 NOTE — Patient Instructions (Signed)

## 2018-02-03 NOTE — Progress Notes (Signed)
Chief Complaint: Follow up Extracranial Carotid Artery Stenosis   History of Present Illness  [name] is a 84 y.o. female who is s/p left carotid endarterectomy May 2017 by Dr. Oneida Alar. She has a hx of right carotid artery stenting at [facility] in the 1980's.   Her primary concern is that her mouth is very sore and dry for 2 weeks, mild case of this for "a long time" prior to this. This has been evaluated by ENT and her dentist. She has lost weight due to this.   She denies any new symptoms of TIA amaurosis or stroke. She states that her swallowing symptoms are similar to preop.   Additionally, the patient complains of a cool sensation in her feet that has been present for the last 2-3 years. She denies any claudication symptoms. She states that the left leg to get worse after a hip operation in 2015 She had a venous duplex ultrasound at [facility] radiology on February 2018 which showed no DVT. She also had an arterial duplex scan which showed minimal superficial femoral artery occlusive disease but normal ABIs. This was also in February 2018.  Review of systems: She develops occasional leg swelling. She has dyspnea with exertion. She states she feels tired. She reports occasional chest heaviness, and feeling her heart beat irregularly.   Dr. Oneida Alar last evaluated pt on 12-17-16. At that time carotid duplex showed 60-80% right internal carotid artery stenosis widely patent left side  #1 dizziness Dr. Oneida Alar reassured the patient the dizziness is not usually related to carotid disease. Dizziness symptoms related carotid occlusive disease usually requires high-grade stenosis of both vertebrals and carotid arteries. She has a moderate right internal carotid artery stenosis. More likely her dizziness is related to postural changes versus orthostasis versus cardiac arrhythmia versus inner ear.  Dr. Oneida Alar discussed with her possible workup of any of these but she will leave this at the  discretion of her primary care physician and prefers not any further workup unless necessary. #2 carotid occlusive disease patient needs a follow-up carotid duplex scan in 1 year.  She was to see our nurse practitioner at that office visit. #3 cool left foot most likely this is more related to neuropathy positional changes. She had a palpable left dorsalis pedis pulse with normal ABIs on that side. Dr. Oneida Alar did not believe this needs any further workup from a vascular standpoint.  She denies claudication type symptoms with walking.     120-148/60's, her blood pressure at home.    Diabetic: no Tobacco use: former smoker, quit in 1966  Pt meds include: Statin : yes ASA: yes Other anticoagulants/antiplatelets: no   Past Medical History:  Diagnosis Date  . Anemia    hx of. None since hysterectomy  . Arthritis   . Asthma   . Bronchitis    hx of  . CAD (coronary artery disease)   . Cancer ([place])    skin cancer on right hand  . Carotid artery occlusion   . Complication of anesthesia    Blood pressure dropped after hammertoe surgery; difficulty waking up  . COPD (chronic obstructive pulmonary disease) ([place])   . Coronary artery disease    prox LAD stent '06  . Enlarged heart   . GERD (gastroesophageal reflux disease)   . HOH (hard of hearing)    severe hearing loss- Wears Bilateral hearing aids  . Hypercholesteremia   . Hypertension   . Peripheral vascular disease ([place])   . Pneumonia  hx of  . Restless leg syndrome   . Shortness of breath    With ambulation at times  . Syncope 2009   Has not had another episode since 2009  . Urination frequency    excessive at bedtime  . Vertigo    hx of    Social History Social History   Tobacco Use  . Smoking status: Former Smoker    Types: Cigarettes    Last attempt to quit: 11/15/1964    Years since quitting: 53.2  . Smokeless tobacco: Never Used  Substance Use Topics  . Alcohol use: No  . Drug use: No    Family  History Family History  Problem Relation Age of Onset  . Heart disease Sister        before age 21  . Heart disease Son        before age 29    Surgical History Past Surgical History:  Procedure Laterality Date  . ABDOMINAL HYSTERECTOMY    . ANTERIOR LAT LUMBAR FUSION  04/06/2012   Procedure: ANTERIOR LATERAL LUMBAR FUSION 1 LEVEL;  Surgeon: Melina Schools, MD;  Location: [facility];  Service: Orthopedics;  Laterality: N/A;  XLIF L3-L4 with Posterior Instrumentation   . APPENDECTOMY    . BACK SURGERY    . CARDIAC CATHETERIZATION    . CHOLECYSTECTOMY    . COLONOSCOPY    . CORONARY ANGIOPLASTY  2006   Prox LAD  . ENDARTERECTOMY Left 12/11/2015   Procedure: LEFT CAROTID ENDARTERECTOMY WITH PATCH ANGIOPLASTY;  Surgeon: Elam Dutch, MD;  Location: [facility];  Service: Vascular;  Laterality: Left;  . EXCISION/RELEASE BURSA HIP Right 11/23/2012   Procedure: RIGHT HIP BURSECTOMY AND TENDON REPAIR;  Surgeon: Gearlean Alf, MD;  Location: [facility];  Service: Orthopedics;  Laterality: Right;  . EYE SURGERY  2012   Bilateral cataract removal  . FOOT SURGERY     Right foot hammer toe surgery  . INNER EAR SURGERY    . JOINT REPLACEMENT  05/2011   Left knee replacement  . TONSILLECTOMY      Allergies  Allergen Reactions  . Norco [Hydrocodone-Acetaminophen] Other (See Comments)    Patient states allergy to Hydrocodone. "Jittery, doesn't work."  . Pineapple Other (See Comments)    Mouth stings and burns  . Sulfa Antibiotics Swelling  . Latex Rash    Current Outpatient Medications  Medication Sig Dispense Refill  . albuterol-ipratropium (COMBIVENT) 18-103 MCG/ACT inhaler Inhale 2 puffs into the lungs every 4 (four) hours as needed for shortness of breath. Reported on 11/15/2015    . atorvastatin (LIPITOR) 10 MG tablet Take 10 mg by mouth at bedtime.     . Biotin 10 MG TABS Take 1 tablet by mouth daily.    . Cyanocobalamin 1000 MCG/ML KIT Inject 2 mLs as directed every 30 (thirty) days.    Marland Kitchen  doxycycline (VIBRA-TABS) 100 MG tablet TAKE ONE TABLET BY MOUTH TWICE DAILY FOR 10 DAYS  0  . esomeprazole (NEXIUM) 40 MG capsule Take 40 mg by mouth 2 (two) times daily.     . fesoterodine (TOVIAZ) 8 MG TB24 Take 8 mg by mouth daily.    . mirtazapine (REMERON) 15 MG tablet Take 15 mg by mouth at bedtime.    . montelukast (SINGULAIR) 10 MG tablet Take 10 mg by mouth at bedtime.    . nitroGLYCERIN (NITROSTAT) 0.4 MG SL tablet Place 0.4 mg under the tongue every 5 (five) minutes as needed for chest pain.     Marland Kitchen  Oxycodone HCl 10 MG TABS Take 1 tablet (10 mg total) by mouth every 4 (four) hours as needed. 15 tablet 0  . polyethylene glycol (MIRALAX / GLYCOLAX) packet Take 17 g by mouth daily as needed (constipation). Reported on 11/15/2015    . potassium chloride SA (K-DUR,KLOR-CON) 20 MEQ tablet Take 20 mEq by mouth 2 (two) times daily.    Marland Kitchen rOPINIRole (REQUIP) 1 MG tablet Take 2 mg by mouth at bedtime.     . torsemide (DEMADEX) 20 MG tablet Take 20 mg by mouth 2 (two) times daily.    . Vitamin D, Ergocalciferol, (DRISDOL) 50000 units CAPS capsule Take 50,000 Units by mouth every 7 (seven) days. Sunday    . zolpidem (AMBIEN) 10 MG tablet Take 10 mg by mouth at bedtime.     Marland Kitchen amLODipine (NORVASC) 5 MG tablet Take 5 mg by mouth.    . denosumab (PROLIA) 60 MG/ML SOLN injection Inject 60 mg into the skin every 6 (six) months.     . fluticasone (FLONASE) 50 MCG/ACT nasal spray Place 1 spray into both nostrils daily.      No current facility-administered medications for this visit.     Review of Systems : See HPI for pertinent positives and negatives.  Physical Examination  Vitals:   02/03/18 1338 02/03/18 1344  BP: (!) 151/68 (!) 143/67  Pulse: 67   Temp: 98.2 F (36.8 C)   TempSrc: Oral   SpO2: 96%   Weight: 137 lb (62.1 kg)   Height: 5' (1.524 m)    Body mass index is 26.76 kg/m.  General: WDWN female in NAD GAIT: slow, steady, using cane Eyes: PERRLA HENT: Dry irritation at lips,  glossitis of tongue Pulmonary:  Respirations are non-labored, good air movement in all fields, CTAB, no rales, rhonchi, or wheezing. Cardiac: irregular rhythm, controled rate, no detected murmur.  VASCULAR EXAM Carotid Bruits Right Left   Positive Negative     Abdominal aortic pulse is not palpable. Radial pulses are 2+ palpable and equal.                                                                                                                            LE Pulses Right Left       POPLITEAL  not palpable   not palpable       POSTERIOR TIBIAL  not palpable   not palpable        DORSALIS PEDIS      ANTERIOR TIBIAL 1+ palpable  not palpable     Gastrointestinal: soft, nontender, BS WNL, no r/g, no palpable masses. Musculoskeletal: no muscle atrophy/wasting. M/S 5/5 throughout except 4/5 in bilateral LE, extremities without ischemic changes. Skin: No rashes, no ulcers, no cellulitis.   Neurologic:  A&O X 3; appropriate affect, sensation is normal; speech is normal, CN 2-12 intact, pain and light touch intact in extremities, motor exam as listed above. Psychiatric: Normal thought content, mood appropriate to clinical situation.  Assessment: [name] is a 84 y.o. female who is s/p left carotid endarterectomy May 2017 by Dr. Oneida Alar. She has a hx of right carotid artery stenting at [facility] in the 1980's.   She has no hx of stroke or TIA.  She quit smoking in 1966 and does not have DM. She takes a daily statin and ASA.   DATA Carotid Duplex (02/03/18): Right ICA: 40-59% stenosis Left ICA: CEA site with no restenosis Right ECA: >50% stenosis Bilateral vertebral artery flow is antegrade.  Bilateral subclavian artery waveforms are normal.  Less stenosis in the right ICA noted compared to the exam on 12-17-16.   Plan:  Undiagnosed as of yet arrhythmia, slightly symptomatic with DOE, feeling tired, can feel her heart beating irregularly at times; referral to  cardiologist. She states she has not been evaluated by a cardiologist.  Pt's daughter will ask the ENT she has seen about referral to larger ENT center for evaluation and management of glossitis and dry mouth.   Follow-up in 9 months with Carotid Duplex scan.   I discussed in depth with the patient the nature of atherosclerosis, and emphasized the importance of maximal medical management including strict control of blood pressure, blood glucose, and lipid levels, obtaining regular exercise, and continued cessation of smoking.  The patient is aware that without maximal medical management the underlying atherosclerotic disease process will progress, limiting the benefit of any interventions. The patient was given information about stroke prevention and what symptoms should prompt the patient to seek immediate medical care. Thank you for allowing [place] to participate in this patient's care.  Clemon Chambers, RN, MSN, FNP-C [facility] [place] Office: [phone]  Clinic Physician: Oneida Alar  02/03/18 2:05 PM

## 2018-02-15 ENCOUNTER — Telehealth: Payer: Self-pay | Admitting: Surgery

## 2018-02-15 NOTE — Telephone Encounter (Signed)
I called [place] at [phone] and spoke with Debra scheduled pt for appt on 03/22/18 @ 3:00.  Called grand daughter Vicente Males cell phone, unable to leave voicemail mail box full.  Called and spoke with patient and gave her all the info and the number to there office in case they need to R/S the appt.

## 2018-02-16 ENCOUNTER — Other Ambulatory Visit: Payer: Self-pay

## 2018-02-16 DIAGNOSIS — I6523 Occlusion and stenosis of bilateral carotid arteries: Secondary | ICD-10-CM

## 2018-03-22 ENCOUNTER — Ambulatory Visit (INDEPENDENT_AMBULATORY_CARE_PROVIDER_SITE_OTHER): Payer: Medicare Other | Admitting: Cardiology

## 2018-03-22 ENCOUNTER — Encounter: Payer: Self-pay | Admitting: Cardiology

## 2018-03-22 VITALS — BP 128/62 | HR 64 | Ht 60.0 in | Wt 143.8 lb

## 2018-03-22 DIAGNOSIS — R002 Palpitations: Secondary | ICD-10-CM | POA: Diagnosis not present

## 2018-03-22 DIAGNOSIS — I6523 Occlusion and stenosis of bilateral carotid arteries: Secondary | ICD-10-CM | POA: Diagnosis not present

## 2018-03-22 NOTE — Progress Notes (Signed)
Cardiology Office Note:    Date:  03/22/2018   ID:  [name], DOB Oct 24, 1929, MRN [id number]  PCP:  Gennette Pac, NP  Cardiologist:  Buford Dresser, MD PhD  Referring MD: Nickel, Sharmon Leyden, NP   CC: strange chest sensation, ?irregular beat  History of Present Illness:    [name] is a 84 y.o. female with a hx of carotid stenosis s/p L CEA in 12/2015 and prior R carotid stenting who is seen as a new consult at the request of [name] for evaluation of new arrhythmia.  Per note of 02/03/18, patient endorsed feelings of irregular heart beat while being seen by her vascular specialist. The exam notes an irregular heart beat.   Per the patient and her granddaughter, in the last six weeks, she has had three episodes of a strange feeling in her chest. Not related to exercise or position. Lasts briefly. Thinks her heart might be irregular. No chest pain, SOB, PND, orthopnea, syncope. She is very active at baseline, doing yardwork, painting her house, etc. No symptoms that limit her activity level.   She had an echo and a stress test (seen in Care Everywhere, noted below) prior to her carotid surgery.   Bigger issues for her have been tooth loss and mouth pain, which is thought to be due to a side effect of medication. She is being followed for this. She also reports intermittent sensation of cold extremities. She discussed this with her vascular specialist as well and was noted to have strong pulses.  Past Medical History:  Diagnosis Date  . Anemia    hx of. None since hysterectomy  . Arthritis   . Asthma   . Bronchitis    hx of  . CAD (coronary artery disease)   . Cancer ([place])    skin cancer on right hand  . Carotid artery occlusion   . Complication of anesthesia    Blood pressure dropped after hammertoe surgery; difficulty waking up  . COPD (chronic obstructive pulmonary disease) ([place])   . Coronary artery disease    prox LAD stent '06  . Enlarged heart     . GERD (gastroesophageal reflux disease)   . HOH (hard of hearing)    severe hearing loss- Wears Bilateral hearing aids  . Hypercholesteremia   . Hypertension   . Peripheral vascular disease ([place])   . Pneumonia    hx of  . Restless leg syndrome   . Shortness of breath    With ambulation at times  . Syncope 2009   Has not had another episode since 2009  . Urination frequency    excessive at bedtime  . Vertigo    hx of    Past Surgical History:  Procedure Laterality Date  . ABDOMINAL HYSTERECTOMY    . ANTERIOR LAT LUMBAR FUSION  04/06/2012   Procedure: ANTERIOR LATERAL LUMBAR FUSION 1 LEVEL;  Surgeon: Melina Schools, MD;  Location: [facility];  Service: Orthopedics;  Laterality: N/A;  XLIF L3-L4 with Posterior Instrumentation   . APPENDECTOMY    . BACK SURGERY    . CARDIAC CATHETERIZATION    . CHOLECYSTECTOMY    . COLONOSCOPY    . CORONARY ANGIOPLASTY  2006   Prox LAD  . ENDARTERECTOMY Left 12/11/2015   Procedure: LEFT CAROTID ENDARTERECTOMY WITH PATCH ANGIOPLASTY;  Surgeon: Elam Dutch, MD;  Location: [facility];  Service: Vascular;  Laterality: Left;  . EXCISION/RELEASE BURSA HIP Right 11/23/2012   Procedure: RIGHT HIP BURSECTOMY  AND TENDON REPAIR;  Surgeon: Gearlean Alf, MD;  Location: [facility];  Service: Orthopedics;  Laterality: Right;  . EYE SURGERY  2012   Bilateral cataract removal  . FOOT SURGERY     Right foot hammer toe surgery  . INNER EAR SURGERY    . JOINT REPLACEMENT  05/2011   Left knee replacement  . TONSILLECTOMY      Current Medications: Current Outpatient Medications on File Prior to Visit  Medication Sig  . albuterol-ipratropium (COMBIVENT) 18-103 MCG/ACT inhaler Inhale 2 puffs into the lungs every 4 (four) hours as needed for shortness of breath. Reported on 11/15/2015  . amLODipine (NORVASC) 5 MG tablet Take 5 mg by mouth.  Marland Kitchen amoxicillin (AMOXIL) 500 MG capsule Take four tablets prior to dental procedures  . aspirin EC 81 MG tablet Take 81 mg by mouth  daily.   Marland Kitchen atorvastatin (LIPITOR) 10 MG tablet Take 10 mg by mouth at bedtime.   . Biotin 10 MG TABS Take 1 tablet by mouth daily.  . Cyanocobalamin 1000 MCG/ML KIT Inject 2 mLs as directed every 30 (thirty) days.  . fesoterodine (TOVIAZ) 8 MG TB24 Take 8 mg by mouth daily.  . fluticasone (FLONASE) 50 MCG/ACT nasal spray Place 1 spray into both nostrils daily.   . meclizine (ANTIVERT) 25 MG tablet Take 25 mg by mouth 3 (three) times daily as needed.   . mirtazapine (REMERON) 15 MG tablet Take 15 mg by mouth at bedtime.  . montelukast (SINGULAIR) 10 MG tablet Take 10 mg by mouth at bedtime.  . nitroGLYCERIN (NITROSTAT) 0.4 MG SL tablet Place 0.4 mg under the tongue every 5 (five) minutes as needed for chest pain.   Marland Kitchen omeprazole (PRILOSEC) 20 MG capsule Take 20 mg by mouth 2 (two) times daily before a meal.   . Oxycodone HCl 10 MG TABS Take 1 tablet (10 mg total) by mouth every 4 (four) hours as needed.  . polyethylene glycol (MIRALAX / GLYCOLAX) packet Take 17 g by mouth daily as needed (constipation). Reported on 11/15/2015  . potassium chloride SA (K-DUR,KLOR-CON) 20 MEQ tablet Take 20 mEq by mouth 2 (two) times daily.  Marland Kitchen rOPINIRole (REQUIP) 1 MG tablet Take 2 mg by mouth at bedtime.   . torsemide (DEMADEX) 20 MG tablet Take 20 mg by mouth 2 (two) times daily.  . Vitamin D, Ergocalciferol, (DRISDOL) 50000 units CAPS capsule Take 50,000 Units by mouth every 7 (seven) days. Sunday  . zolpidem (AMBIEN) 10 MG tablet Take 10 mg by mouth at bedtime.    No current facility-administered medications on file prior to visit.      Allergies:   Fluconazole; Norco [hydrocodone-acetaminophen]; Pineapple; Sulfa antibiotics; and Latex   Social History   Socioeconomic History  . Marital status: Widowed    Spouse name: Not on file  . Number of children: Not on file  . Years of education: Not on file  . Highest education level: Not on file  Occupational History  . Not on file  Social Needs  . Financial  resource strain: Not on file  . Food insecurity:    Worry: Not on file    Inability: Not on file  . Transportation needs:    Medical: Not on file    Non-medical: Not on file  Tobacco Use  . Smoking status: Former Smoker    Types: Cigarettes    Last attempt to quit: 11/15/1964    Years since quitting: 53.3  . Smokeless tobacco: Never Used  Substance and Sexual Activity  . Alcohol use: No  . Drug use: No  . Sexual activity: Not Currently  Lifestyle  . Physical activity:    Days per week: Not on file    Minutes per session: Not on file  . Stress: Not on file  Relationships  . Social connections:    Talks on phone: Not on file    Gets together: Not on file    Attends religious service: Not on file    Active member of club or organization: Not on file    Attends meetings of clubs or organizations: Not on file    Relationship status: Not on file  Other Topics Concern  . Not on file  Social History Narrative  . Not on file     Family History: The patient'sfamily history includes Heart disease in her sister and son. She otherwise does not know her family history.  ROS:   Please see the history of present illness.  Additional pertinent ROS: Review of Systems  Constitutional: Negative for chills and fever.  HENT: Positive for hearing loss. Negative for ear pain.   Eyes: Negative for photophobia and pain.  Respiratory: Negative for sputum production and shortness of breath.   Cardiovascular: Positive for palpitations. Negative for chest pain, orthopnea, claudication and leg swelling.  Gastrointestinal: Negative for abdominal pain, blood in stool and melena.  Genitourinary: Negative for dysuria and hematuria.  Musculoskeletal: Negative for falls and myalgias.  Skin: Negative for rash.  Neurological: Negative for focal weakness and loss of consciousness.  Endo/Heme/Allergies: Does not bruise/bleed easily.   EKGs/Labs/Other Studies Reviewed:    The following studies were  reviewed today: Echo 11/27/16  Normal left ventricular systolic function, ejection fraction 65 to 74%  Diastolic dysfunction - grade II (elevated filling pressures)  Dilated left atrium - mild  Left ventricular hypertrophy - mild  Aortic sclerosis  Aortic stenosis - mild  Normal right ventricular systolic function  Tricuspid regurgitation - mild  Elevated pulmonary artery systolic pressure - borderline  Nuclear stress 12/09/2015 regadenoson Impressions: - Low risk study  - No significant ischemia is noted on perfusion imaging  - There is a moderate in size, moderate in severity, fixed defect  involving the apical anterior, apical lateral and apical segments.This  is consistent with scar or less likely due to attenuation artifact.  - Post stress:Global systolic function is normal.The ejection fraction  was greater than 65%.  - Breast attenuation is noted  EKG:  EKG is ordered today.  The ekg ordered today demonstrates normal sinus rhythm  Recent Labs: No results found for requested labs within last 8760 hours.  Recent Lipid Panel No results found for: CHOL, TRIG, HDL, CHOLHDL, VLDL, LDLCALC, LDLDIRECT  Physical Exam:    VS:  BP 128/62   Pulse 64   Ht 5' (1.524 m)   Wt 143 lb 12.8 oz (65.2 kg)   BMI 28.08 kg/m     Wt Readings from Last 3 Encounters:  03/22/18 143 lb 12.8 oz (65.2 kg)  02/03/18 137 lb (62.1 kg)  12/17/16 152 lb 6.4 oz (69.1 kg)     GEN: Well nourished, well developed in no acute distress HEENT: Normal NECK: No JVD; quiet R carotid bruit LYMPHATICS: No lymphadenopathy CARDIAC: regular rhythm, normal S1 and S2, no rubs, gallops. 1/6 early peaking systolic ejection murmur. Radial pulses 2+ bilaterally, DP pulses 1+ bilateral. RESPIRATORY:  Clear to auscultation without rales, wheezing or rhonchi  ABDOMEN: Soft, non-tender, non-distended MUSCULOSKELETAL:  No  edema; No deformity  SKIN: Warm and dry NEUROLOGIC:  Alert and oriented x  3 PSYCHIATRIC:  Normal affect   ASSESSMENT:    1. Palpitations   2. Asymptomatic bilateral carotid artery stenosis    PLAN:    1. Palpitations:  Normal sinus on ECG today, murmur consistent with aortic sclerosis. Has had stress test and ECG within recent years, though symptoms have only started in the last six weeks or so -will start with two week monitor to evaluate for arrhyhtmia.  -if monitor unrevealing, will consider repeating echo given her change in symptoms since prior -felt "terrible" with regadenason stress test in the past, but unlikely to be able to treadmill. Will need to discuss pros/cons of stress test again if workup unrevealing.  2. Bilateral carotid disease: followed by vascular, no symptoms.  Plan for follow up: 3 months  Medication Adjustments/Labs and Tests Ordered: Current medicines are reviewed at length with the patient today.  Concerns regarding medicines are outlined above.  Orders Placed This Encounter  Procedures  . CARDIAC EVENT MONITOR  . EKG 12-Lead   No orders of the defined types were placed in this encounter.   Patient Instructions  Your physician has recommended that you wear an event monitor for 2 weeks. Event monitors are medical devices that record the heart's electrical activity. Doctors most often [place] these monitors to diagnose arrhythmias. Arrhythmias are problems with the speed or rhythm of the heartbeat. The monitor is a small, portable device. You can wear one while you do your normal daily activities. This is usually used to diagnose what is causing palpitations/syncope (passing out). -- this is done at [address] [place] physician recommends that you schedule a follow-up appointment in [place] with Dr. Harrell Gave     Signed, Buford Dresser, MD PhD 03/22/2018 4:51 PM    [place]

## 2018-03-22 NOTE — Patient Instructions (Signed)
Your physician has recommended that you wear an event monitor for 2 weeks. Event monitors are medical devices that record the heart's electrical activity. Doctors most often [place] these monitors to diagnose arrhythmias. Arrhythmias are problems with the speed or rhythm of the heartbeat. The monitor is a small, portable device. You can wear one while you do your normal daily activities. This is usually used to diagnose what is causing palpitations/syncope (passing out). -- this is done at [address] [place] physician recommends that you schedule a follow-up appointment in [place] with Dr. Harrell Gave

## 2018-03-31 ENCOUNTER — Ambulatory Visit (INDEPENDENT_AMBULATORY_CARE_PROVIDER_SITE_OTHER): Payer: Medicare Other

## 2018-03-31 DIAGNOSIS — R002 Palpitations: Secondary | ICD-10-CM

## 2018-04-28 ENCOUNTER — Telehealth: Payer: Self-pay | Admitting: Cardiology

## 2018-04-28 NOTE — Telephone Encounter (Signed)
Pt updated with monitor result. Verbalized understanding with no further questions.

## 2018-04-28 NOTE — Telephone Encounter (Signed)
Follow Up:     Returning your call from today, concerning her monitor results.

## 2018-06-22 ENCOUNTER — Ambulatory Visit (INDEPENDENT_AMBULATORY_CARE_PROVIDER_SITE_OTHER): Payer: Medicare Other | Admitting: Cardiology

## 2018-06-22 ENCOUNTER — Encounter: Payer: Self-pay | Admitting: Cardiology

## 2018-06-22 VITALS — BP 110/56 | HR 64 | Ht 60.0 in | Wt 140.2 lb

## 2018-06-22 DIAGNOSIS — Z7189 Other specified counseling: Secondary | ICD-10-CM | POA: Diagnosis not present

## 2018-06-22 DIAGNOSIS — R002 Palpitations: Secondary | ICD-10-CM | POA: Diagnosis not present

## 2018-06-22 DIAGNOSIS — I6523 Occlusion and stenosis of bilateral carotid arteries: Secondary | ICD-10-CM

## 2018-06-22 NOTE — Patient Instructions (Signed)
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
## 2018-06-23 ENCOUNTER — Encounter: Payer: Self-pay | Admitting: Cardiology

## 2018-06-24 ENCOUNTER — Ambulatory Visit: Payer: Medicare Other | Admitting: Cardiology

## 2018-11-03 ENCOUNTER — Ambulatory Visit: Payer: Medicare Other | Admitting: Family

## 2018-11-03 ENCOUNTER — Encounter (HOSPITAL_COMMUNITY): Payer: Medicare Other

## 2018-12-12 ENCOUNTER — Telehealth: Payer: Self-pay | Admitting: *Deleted

## 2018-12-12 NOTE — Telephone Encounter (Signed)
12/12/18 LMOM to call and schedule follow up appointment @ 0921.

## 2019-01-16 ENCOUNTER — Telehealth (HOSPITAL_COMMUNITY): Payer: Self-pay | Admitting: Rehabilitation

## 2019-01-18 ENCOUNTER — Ambulatory Visit: Payer: Medicare Other | Admitting: Family

## 2019-01-18 ENCOUNTER — Encounter (HOSPITAL_COMMUNITY): Payer: Medicare Other

## 2019-03-15 ENCOUNTER — Telehealth: Payer: Self-pay | Admitting: Cardiology

## 2019-03-15 NOTE — Telephone Encounter (Signed)
New Message:   She says pt's heart rate is low- its 50 at times, not all the time and she is dizzy. I made an appt on 03-22-19. Please call and evaluate to see if pt needs to be seen this week.

## 2019-03-15 NOTE — Telephone Encounter (Signed)
Returned call to patient of Dr. Harrell Gave. Granddaughter Vicente Males reports patient is "dizzy all the time". A few nights ago, patient told her that she was really dizzy and her HR was as low as the 50s. Patient was dizzy yesterday and fell. Patient checks BP but granddaughter is not aware of the readings. Vicente Males states that she feels her grandma (patient) does not tell her everything as she does not want her to worry. Asked that patient write down BP/HR readings if she is not already doing so. She has an appt next week 8/12 with NP. There are no APP appointments this week.   She would like Dr. Harrell Gave to review and symptoms and advise. She is not on anything to lower HR.

## 2019-03-15 NOTE — Telephone Encounter (Signed)
Spoke with granddaughter Vicente Males and advised that patient check BP, HR at least twice daily and if she is symptomatic (dizzy for example) then record VS at this time and denote symptoms. Bring readings to her visit next week. Advised when to seek ED eval. She voiced understanding.

## 2019-03-15 NOTE — Telephone Encounter (Signed)
Thank you. I agree with monitoring heart rate and blood pressure, please bring to appt with APP. If she has complete loss of consciousness she should present to the ER for evaluation. Thank you.

## 2019-03-20 ENCOUNTER — Other Ambulatory Visit: Payer: Self-pay

## 2019-03-20 DIAGNOSIS — I6523 Occlusion and stenosis of bilateral carotid arteries: Secondary | ICD-10-CM

## 2019-03-22 ENCOUNTER — Other Ambulatory Visit: Payer: Self-pay

## 2019-03-22 ENCOUNTER — Encounter: Payer: Self-pay | Admitting: Cardiovascular Disease

## 2019-03-22 ENCOUNTER — Ambulatory Visit (INDEPENDENT_AMBULATORY_CARE_PROVIDER_SITE_OTHER): Payer: Medicare Other | Admitting: Cardiovascular Disease

## 2019-03-22 VITALS — BP 155/68 | HR 61 | Ht 60.0 in | Wt 136.6 lb

## 2019-03-22 DIAGNOSIS — R42 Dizziness and giddiness: Secondary | ICD-10-CM

## 2019-03-22 DIAGNOSIS — R001 Bradycardia, unspecified: Secondary | ICD-10-CM | POA: Diagnosis not present

## 2019-03-22 DIAGNOSIS — I5032 Chronic diastolic (congestive) heart failure: Secondary | ICD-10-CM | POA: Diagnosis not present

## 2019-03-22 DIAGNOSIS — I350 Nonrheumatic aortic (valve) stenosis: Secondary | ICD-10-CM

## 2019-03-22 DIAGNOSIS — Z79899 Other long term (current) drug therapy: Secondary | ICD-10-CM | POA: Diagnosis not present

## 2019-03-22 DIAGNOSIS — I6523 Occlusion and stenosis of bilateral carotid arteries: Secondary | ICD-10-CM

## 2019-03-22 MED ORDER — TORSEMIDE 20 MG PO TABS: 20.0000 mg | ORAL_TABLET | Freq: Every day | ORAL | 11 refills | Status: AC | PRN

## 2019-03-22 NOTE — Progress Notes (Signed)
Cardiology Office Note:   Date:  03/22/2019  NAME:  [name]    MRN: [id number] DOB:  Feb 24, 1930   PCP:  Gennette Pac, NP  Cardiologist:  Buford Dresser, MD  Electrophysiologist:  None   Referring MD: Gennette Pac, NP   Chief Complaint  Patient presents with  . Dizziness  . Fatigue   History of Present Illness:   [name] is a 85 y.o. female with a hx of CAS s/p L CEA 12/2015 (prior R ICA stenting) who presents for evaluation of dizziness.  She reports for the past 3 to 4 months worsening dizziness and fatigue.  She reports generalized fatigue with all activities and is at times associated with dizziness.  No specific triggers are mentioned, none no change with position.  She reports poor balance as well as several falls.  She struggles with adequate p.o. intake, including fluids.  She also reports weight loss of the past several months.  Her granddaughter presents with her and also has concerns for routine memory loss, however there are no concerns for dementia or advanced cognitive impairment.  Her sleep patterns are also disordered, but she reports no depressive symptoms.  She states she is staying active and talks to family members and church members on the phone due to the codependent.  She reports no bloody stools or dark stools, but is unsure of her last labs.  No thyroid has been checked in the system and she is unclear if her primary care physician does this.  Her hemoglobin was noted to be a little low on her most recent CBC in 2017, and nothing has been repeated.  Of note, she takes torsemide 20 mg twice a day and has done this for several months.  She was instructed that at one point she had increased lower extremity edema and was instructed to take this as described.  She reports she can miss a dose or 2 and not really notice increased weights or lower extremity edema.  No recent medication changes noted.  She was seen by Cardiology for bradycardia and  dizziness in July of 2019 and underwent ambulatory telemetry that showed no arrhythmias or brady episodes to explain her symptoms. Symptoms of dizziness coincided with normal sinus rhythm. Of note, she had a 2D TTE in 2018 that showed normal EF, Grade 2 DD. NM stress test normal in 2017 prior to CEA.    Past Medical History: Past Medical History:  Diagnosis Date  . Anemia    hx of. None since hysterectomy  . Arthritis   . Asthma   . Bronchitis    hx of  . CAD (coronary artery disease)   . Cancer ([place])    skin cancer on right hand  . Carotid artery occlusion   . Complication of anesthesia    Blood pressure dropped after hammertoe surgery; difficulty waking up  . COPD (chronic obstructive pulmonary disease) ([place])   . Coronary artery disease    prox LAD stent '06  . Enlarged heart   . GERD (gastroesophageal reflux disease)   . HOH (hard of hearing)    severe hearing loss- Wears Bilateral hearing aids  . Hypercholesteremia   . Hypertension   . Peripheral vascular disease ([place])   . Pneumonia    hx of  . Restless leg syndrome   . Shortness of breath    With ambulation at times  . Syncope 2009   Has not had another episode since 2009  .  Urination frequency    excessive at bedtime  . Vertigo    hx of    Past Surgical History: Past Surgical History:  Procedure Laterality Date  . ABDOMINAL HYSTERECTOMY    . ANTERIOR LAT LUMBAR FUSION  04/06/2012   Procedure: ANTERIOR LATERAL LUMBAR FUSION 1 LEVEL;  Surgeon: Melina Schools, MD;  Location: [facility];  Service: Orthopedics;  Laterality: N/A;  XLIF L3-L4 with Posterior Instrumentation   . APPENDECTOMY    . BACK SURGERY    . CARDIAC CATHETERIZATION    . CHOLECYSTECTOMY    . COLONOSCOPY    . CORONARY ANGIOPLASTY  2006   Prox LAD  . ENDARTERECTOMY Left 12/11/2015   Procedure: LEFT CAROTID ENDARTERECTOMY WITH PATCH ANGIOPLASTY;  Surgeon: Elam Dutch, MD;  Location: [facility];  Service: Vascular;  Laterality: Left;  . EXCISION/RELEASE  BURSA HIP Right 11/23/2012   Procedure: RIGHT HIP BURSECTOMY AND TENDON REPAIR;  Surgeon: Gearlean Alf, MD;  [facility];  Service: Orthopedics;  Laterality: Right;  . EYE SURGERY  2012   Bilateral cataract removal  . FOOT SURGERY     Right foot hammer toe surgery  . INNER EAR SURGERY    . JOINT REPLACEMENT  05/2011   Left knee replacement  . TONSILLECTOMY      Current Medications: Current Meds  Medication Sig  . ADVAIR DISKUS 250-50 MCG/DOSE AEPB Inhale 1 puff into the lungs as needed.  Marland Kitchen albuterol-ipratropium (COMBIVENT) 18-103 MCG/ACT inhaler Inhale 2 puffs into the lungs every 4 (four) hours as needed for shortness of breath. Reported on 11/15/2015  . amLODipine (NORVASC) 5 MG tablet Take 5 mg by mouth.  Marland Kitchen amoxicillin (AMOXIL) 500 MG capsule Take four tablets prior to dental procedures  . atorvastatin (LIPITOR) 10 MG tablet Take 10 mg by mouth at bedtime.   . Biotin 10 MG TABS Take 1 tablet by mouth daily.  . Cyanocobalamin 1000 MCG/ML KIT Inject 2 mLs as directed every 30 (thirty) days.  . fesoterodine (TOVIAZ) 4 MG TB24 tablet Take 4 mg by mouth daily.   . fluticasone (FLONASE) 50 MCG/ACT nasal spray Place 1 spray into both nostrils daily.   . meclizine (ANTIVERT) 25 MG tablet Take 25 mg by mouth 3 (three) times daily as needed.   . mirtazapine (REMERON) 15 MG tablet Take 15 mg by mouth at bedtime.  . montelukast (SINGULAIR) 10 MG tablet Take 10 mg by mouth at bedtime.  . nitroGLYCERIN (NITROSTAT) 0.4 MG SL tablet Place 0.4 mg under the tongue every 5 (five) minutes as needed for chest pain.   Marland Kitchen omeprazole (PRILOSEC) 20 MG capsule Take 20 mg by mouth 2 (two) times daily before a meal.   . Oxycodone HCl 10 MG TABS Take 1 tablet (10 mg total) by mouth every 4 (four) hours as needed.  . pilocarpine (SALAGEN) 5 MG tablet Take 1 tablet by mouth daily.  . polyethylene glycol (MIRALAX / GLYCOLAX) packet Take 17 g by mouth daily as needed (constipation). Reported on 11/15/2015  .  potassium chloride SA (K-DUR,KLOR-CON) 20 MEQ tablet Take 20 mEq by mouth 2 (two) times daily.  Marland Kitchen rOPINIRole (REQUIP) 1 MG tablet Take 2 mg by mouth at bedtime.   . torsemide (DEMADEX) 20 MG tablet Take 1 tablet (20 mg total) by mouth daily as needed. Swelling, weight gain of 3 lbs in day, or 5 lbs in one week.  . Vitamin D, Ergocalciferol, (DRISDOL) 50000 units CAPS capsule Take 50,000 Units by mouth every 7 (seven)  days. Sunday  . zolpidem (AMBIEN) 10 MG tablet Take 10 mg by mouth at bedtime.   . [DISCONTINUED] torsemide (DEMADEX) 20 MG tablet Take 20 mg by mouth 2 (two) times daily.     Allergies:    Fluconazole, Norco [hydrocodone-acetaminophen], Pineapple, Sulfa antibiotics, and Latex   Social History: Social History   Socioeconomic History  . Marital status: Widowed    Spouse name: Not on file  . Number of children: Not on file  . Years of education: Not on file  . Highest education level: Not on file  Occupational History  . Not on file  Social Needs  . Financial resource strain: Not on file  . Food insecurity    Worry: Not on file    Inability: Not on file  . Transportation needs    Medical: Not on file    Non-medical: Not on file  Tobacco Use  . Smoking status: Former Smoker    Types: Cigarettes    Quit date: 11/15/1964    Years since quitting: 54.3  . Smokeless tobacco: Never Used  Substance and Sexual Activity  . Alcohol use: No  . Drug use: No  . Sexual activity: Not Currently  Lifestyle  . Physical activity    Days per week: Not on file    Minutes per session: Not on file  . Stress: Not on file  Relationships  . Social Herbalist on phone: Not on file    Gets together: Not on file    Attends religious service: Not on file    Active member of club or organization: Not on file    Attends meetings of clubs or organizations: Not on file    Relationship status: Not on file  Other Topics Concern  . Not on file  Social History Narrative  . Not on  file     Family History: The patient's family history includes Heart disease in her sister and son.  ROS:   All other ROS reviewed and negative. Pertinent positives noted in the HPI.     EKGs/Labs/Other Studies Reviewed:   The following studies were personally reviewed today:  Ambulatory ECG (03/31/2018):  Approx 11 days of data. No afib. <1% PVCs, about 1% PAC burden. 9 symptomatic patient triggered events: 4 sinus rhythm, 3 sinus rhythm with PVC, 2 sinus rhythm with PAC. 13 asymptomatic auto recorded events. One had 5 beats of SVT. No significant prolonged arrhythmias.  Carotid Duplex (02/03/2018):  Right Carotid: Velocities in the right ICA are consistent with a 40-59% stenosis. The ECA appears >50% stenosed.  Left Carotid: Patent left carotid endarterectomy site with no evidence of restenosis or hyperplasia. Vertebrals:  Bilateral vertebral arteries demonstrate antegrade flow. Subclavians: Normal flow hemodynamics were seen in bilateral subclavian arteries.  TTE (2018 @ [facility] via [place])  Normal left ventricular systolic function, ejection fraction 65 to 66%  Diastolic dysfunction - grade II (elevated filling pressures)  Dilated left atrium - mild  Left ventricular hypertrophy - mild  Aortic sclerosis  Aortic stenosis - mild  Normal right ventricular systolic function  Tricuspid regurgitation - mild  Elevated pulmonary artery systolic pressure - borderline  NM Stress (2017 @ [facility])  - Low risk study - No significant ischemia is noted on perfusion imaging - There is a moderate in size, moderate in severity, fixed defect  involving the apical anterior, apical lateral and apical segments.This  is consistent with scar or less likely due to attenuation artifact. -  Post stress:Global systolic function is normal.The ejection fraction  was greater than 65%. - Breast attenuation is noted   EKG:  EKG is ordered today.  The ekg ordered today  demonstrates normal sinus rhythm with heart rate 64, no evidence of conduction disease, and was personally reviewed by me.   Recent Labs: No results found for requested labs within last 8760 hours.  Recent Lipid Panel No results found for: CHOL, TRIG, HDL, CHOLHDL, VLDL, LDLCALC, LDLDIRECT  Physical Exam:   VS:  BP (!) 155/68   Pulse 61   Ht 5' (1.524 m)   Wt 136 lb 9.6 oz (62 kg)   BMI 26.68 kg/m     Wt Readings from Last 3 Encounters:  03/22/19 136 lb 9.6 oz (62 kg)  06/22/18 140 lb 3.2 oz (63.6 kg)  03/22/18 143 lb 12.8 oz (65.2 kg)     General: Well nourished, well developed, in no acute distress Heart: Atraumatic, normal size  Eyes: PEERLA, EOMI  Neck: Supple, no JVD Endocrine: No thryomegaly Cardiac: Normal S1, S2; RRR; 2/6 SEM Lungs: Clear to auscultation bilaterally, no wheezing, rhonchi or rales  Abd: Soft, nontender, no hepatomegaly  Ext: No edema, pulses 2+ Musculoskeletal: No deformities, BUE and BLE strength normal and equal Skin: Warm and dry, no rashes   Neuro: Alert and oriented to person, place, time, and situation, CNII-XII grossly intact, no focal deficits  Psych: Normal mood and affect   ASSESSMENT:   NAME@ is a 85 y.o. female who presents for the following: 1. Dizziness   2. Bradycardia   3. Chronic heart failure with preserved ejection fraction ([place])   4. Medication management     PLAN:   1.  Dizziness/fatigue -She presents with 3 to 4 months of worsening dizziness and fatigue that are not positional, and do not appear to be exacerbated by any factors that she can identify.  Her most recent cardiac work-up including ambulatory ECG, TTE, stress testing are unremarkable.  Also of note, when she wore her event monitor she had symptoms of dizziness and fatigue with normal sinus rhythm and heart rate in the 70s. -I do not see any recent labs including CBC or thyroid; will send CBC, TSH, BMP, UA today -There may be an element of overdiuresis, and I have  instructed her to take her torsemide as needed if she notices 2 to 3 pound weight gain over 24 hour time span, or increased lower extremity edema -She takes B12 shots, and if her labs indicate she is anemic we will send an iron profile -I discussed with her and her granddaughter at length that it would be of low yield to repeat her previous cardiac testing given no significant change in symptoms/exam  2.  Bradycardia -At times she has heart rates in the 50-60 range, however these are not associated with symptoms -Recent ambulatory ECG without conduction disease or serious bradyarrhythmia -No further work-up needed  3. aortic stenosis, nonrheumatic, mild - She has a murmur of aortic stenosis which was known and documented as mild on her echocardiogram in 2018 -She will need a repeat echocardiogram in 3 to 5 years (2021-2023)  4. HFpEF, Grade 2 DD -She was noted to have grade 2 diastolic dysfunction on her echocardiogram in 2018 and I assume this is the reason she is continue to take torsemide -She is not volume overload on exam nor does she report symptoms concerning for clinical heart failure -We will continue with her current blood pressure regimen and decrease  her torsemide to as needed  5. HTN -BP is slightly elevated today, however given age we will not pursue aggressive management   Disposition: Return in about 4 weeks (around 04/19/2019) for Follow-up with APP.  Medication Adjustments/Labs and Tests Ordered: Current medicines are reviewed at length with the patient today.  Concerns regarding medicines are outlined above.  Orders Placed This Encounter  Procedures  . CBC  . Basic metabolic panel  . TSH  . Urinalysis  . EKG 12-Lead   Meds ordered this encounter  Medications  . torsemide (DEMADEX) 20 MG tablet    Sig: Take 1 tablet (20 mg total) by mouth daily as needed. Swelling, weight gain of 3 lbs in day, or 5 lbs in one week.    Dispense:  30 tablet    Refill:  11     Patient Instructions  Medication Instructions:  Decrease Torsemide 40 mg daily as needed for swelling or weight gain of 3 lbs in one day or 5 lbs in a week.   If you need a refill on your cardiac medications before your next appointment, please call your pharmacy.   Lab work: Your physician recommends that you return for lab work today ( CBC, BMP, TSH, UA)  If you have labs (blood work) drawn today and your tests are completely normal, you will receive your results only by: Marland Kitchen MyChart Message (if you have MyChart) OR . A paper copy in the mail If you have any lab test that is abnormal or we need to change your treatment, we will call you to review the results.  Testing/Procedures: None  Follow-Up: Your physician recommends that you schedule a follow-up appointment in 4-6 week with Jory Sims, DNP          Signed, Addison Naegeli. Audie Box, [place]  [address], [place], [place] [address] [phone]  03/22/2019 12:45 PM

## 2019-03-22 NOTE — Patient Instructions (Signed)
Medication Instructions:  Decrease Torsemide 40 mg daily as needed for swelling or weight gain of 3 lbs in one day or 5 lbs in a week.   If you need a refill on your cardiac medications before your next appointment, please call your pharmacy.   Lab work: Your physician recommends that you return for lab work today ( CBC, BMP, TSH, UA)  If you have labs (blood work) drawn today and your tests are completely normal, you will receive your results only by: Marland Kitchen MyChart Message (if you have MyChart) OR . A paper copy in the mail If you have any lab test that is abnormal or we need to change your treatment, we will call you to review the results.  Testing/Procedures: None  Follow-Up: Your physician recommends that you schedule a follow-up appointment in 4-6 week with Jory Sims, DNP

## 2019-03-23 ENCOUNTER — Telehealth: Payer: Self-pay | Admitting: *Deleted

## 2019-03-23 ENCOUNTER — Encounter: Payer: Self-pay | Admitting: Family

## 2019-03-23 ENCOUNTER — Other Ambulatory Visit: Payer: Self-pay

## 2019-03-23 ENCOUNTER — Ambulatory Visit (INDEPENDENT_AMBULATORY_CARE_PROVIDER_SITE_OTHER): Payer: Medicare Other | Admitting: Family

## 2019-03-23 ENCOUNTER — Ambulatory Visit (HOSPITAL_COMMUNITY)
Admission: RE | Admit: 2019-03-23 | Discharge: 2019-03-23 | Disposition: A | Discharge status: Home / Self Care | Payer: Medicare Other | Source: Ambulatory Visit | Attending: Family | Admitting: Family

## 2019-03-23 VITALS — BP 166/67 | HR 65 | Temp 97.7°F | Resp 14 | Ht 60.0 in | Wt 133.0 lb

## 2019-03-23 DIAGNOSIS — Z9889 Other specified postprocedural states: Secondary | ICD-10-CM

## 2019-03-23 DIAGNOSIS — I6523 Occlusion and stenosis of bilateral carotid arteries: Secondary | ICD-10-CM

## 2019-03-23 DIAGNOSIS — Z95828 Presence of other vascular implants and grafts: Secondary | ICD-10-CM

## 2019-03-23 LAB — CBC
Hematocrit: 38.2 % (ref 34.0–46.6)
Hemoglobin: 12.3 g/dL (ref 11.1–15.9)
MCH: 25.8 pg — ABNORMAL LOW (ref 26.6–33.0)
MCHC: 32.2 g/dL (ref 31.5–35.7)
MCV: 80 fL (ref 79–97)
Platelets: 288 10*3/uL (ref 150–450)
RBC: 4.76 x10E6/uL (ref 3.77–5.28)
RDW: 13.8 % (ref 11.7–15.4)
WBC: 8.1 10*3/uL (ref 3.4–10.8)

## 2019-03-23 LAB — URINALYSIS
Bilirubin, UA: NEGATIVE
Glucose, UA: NEGATIVE
Ketones, UA: NEGATIVE
Leukocytes,UA: NEGATIVE
Nitrite, UA: NEGATIVE
Protein,UA: NEGATIVE
RBC, UA: NEGATIVE
Specific Gravity, UA: 1.012 (ref 1.005–1.030)
Urobilinogen, Ur: 0.2 mg/dL (ref 0.2–1.0)
pH, UA: 5.5 (ref 5.0–7.5)

## 2019-03-23 LAB — BASIC METABOLIC PANEL
BUN/Creatinine Ratio: 22 (ref 12–28)
BUN: 23 mg/dL (ref 8–27)
CO2: 26 mmol/L (ref 20–29)
Calcium: 9.7 mg/dL (ref 8.7–10.3)
Chloride: 94 mmol/L — ABNORMAL LOW (ref 96–106)
Creatinine, Ser: 1.04 mg/dL — ABNORMAL HIGH (ref 0.57–1.00)
GFR calc Af Amer: 56 mL/min/{1.73_m2} — ABNORMAL LOW (ref >59)
GFR calc non Af Amer: 48 mL/min/{1.73_m2} — ABNORMAL LOW (ref >59)
Glucose: 82 mg/dL (ref 65–99)
Potassium: 4.8 mmol/L (ref 3.5–5.2)
Sodium: 139 mmol/L (ref 134–144)

## 2019-03-23 LAB — TSH: TSH: 1.07 u[IU]/mL (ref 0.450–4.500)

## 2019-03-23 NOTE — Progress Notes (Signed)
Her labs shows her blood counts are stable and she is not anemic. Her thyroid test is normal. Her kidney number is also normal. UA shows no evidence of infection. I would let her know that her lab work-up doesn't reveal anything to explain her dizziness/fatigue. Her recent cardiac testing has been unremarkable. I think she should see if the recommended changes in her torsemide (instructed to take as needed) have any impact on her symptoms. I would also recommend she follow with her primary care physician to determine if further testing is needed.   Evalina Field, MD

## 2019-03-23 NOTE — Patient Instructions (Signed)

## 2019-03-23 NOTE — Progress Notes (Signed)
Virtual Visit via Telephone Note   I connected with [name] on 03/23/2019 using the [email or web address] by telephone and verified that I was speaking with the correct person using two identifiers. Patient was located at her home and accompanied by her daughter. I am located at the [facility] office/clinic.   The limitations of evaluation and management by telemedicine and the availability of in person appointments have been previously discussed with the patient and are documented in the patients chart. The patient expressed understanding and consented to proceed.  PCP: Gennette Pac, NP  Chief Complaint: Follow up Extracranial Carotid Artery Stenosis   History of Present Illness: [name] is a 85 y.o. female who is s/p left carotid endarterectomy May 2017 by Dr. Oneida Alar. She has a hx of right carotid artery stenting at [facility] in the 1980's.   She denies any new symptoms of TIA amaurosis or stroke. She states that her swallowing symptoms are similar to preop.   Dr. Oneida Alar last evaluated pt on 12-17-16. At that time carotid duplex showed 60-80% right internal carotid artery stenosis widely patent left side  #1 dizziness Dr. Oneida Alar reassured the patient the dizziness is not usually related to carotid disease. Dizziness symptoms related carotid occlusive disease usually requires high-grade stenosis of both vertebrals and carotid arteries. She has a moderate right internal carotid artery stenosis. More likely her dizziness is related to postural changes versus orthostasis versus cardiac arrhythmia versus innerear.Dr. Oneida Alar discussed with her possible workup of any of these but she will leave this at the discretion of her primary care physician and prefers not any further workup unless necessary. #2 carotid occlusive disease patient needs a follow-up carotid duplex scan in 1 year.She was to see our nurse practitioner at that office visit. #3 cool left foot most likely this is more  related to neuropathy positional changes. She had a palpable left dorsalis pedis pulse with normal ABIs on that side. Dr. Oneida Alar did not believe this needs any further workup from a vascular standpoint.  She denies claudication type symptoms with walking.     120-148/60's, her blood pressure at home per pt  She was evaluated by Dr. Harrell Gave, cardiologist with [facility], in November 2019 for symptomatic arrhythmia. Her findings were as follows: Normal sinus on monitor with rare PACs/PVCs, no atrial fib. Symptoms unchanged today -declines repeat echo or stress test -no significant arrhythmias noted, she will call us if her symptoms change  She saw her cardiologist again yesterday.   Diabetic: no Tobacco use: former smoker, quit in 1966  Pt meds include: Statin : yes ASA: yes Other anticoagulants/antiplatelets: no   Past Medical History:  Diagnosis Date  . Anemia    hx of. None since hysterectomy  . Arthritis   . Asthma   . Bronchitis    hx of  . CAD (coronary artery disease)   . Cancer ([place])    skin cancer on right hand  . Carotid artery occlusion   . Complication of anesthesia    Blood pressure dropped after hammertoe surgery; difficulty waking up  . COPD (chronic obstructive pulmonary disease) ([place])   . Coronary artery disease    prox LAD stent '06  . Enlarged heart   . GERD (gastroesophageal reflux disease)   . HOH (hard of hearing)    severe hearing loss- Wears Bilateral hearing aids  . Hypercholesteremia   . Hypertension   . Peripheral vascular disease ([place])   . Pneumonia  hx of  . Restless leg syndrome   . Shortness of breath    With ambulation at times  . Syncope 2009   Has not had another episode since 2009  . Urination frequency    excessive at bedtime  . Vertigo    hx of    Past Surgical History:  Procedure Laterality Date  . ABDOMINAL HYSTERECTOMY    . ANTERIOR LAT LUMBAR FUSION  04/06/2012   Procedure: ANTERIOR LATERAL LUMBAR FUSION 1  LEVEL;  Surgeon: Melina Schools, MD;  Location: [facility];  Service: Orthopedics;  Laterality: N/A;  XLIF L3-L4 with Posterior Instrumentation   . APPENDECTOMY    . BACK SURGERY    . CARDIAC CATHETERIZATION    . CHOLECYSTECTOMY    . COLONOSCOPY    . CORONARY ANGIOPLASTY  2006   Prox LAD  . ENDARTERECTOMY Left 12/11/2015   Procedure: LEFT CAROTID ENDARTERECTOMY WITH PATCH ANGIOPLASTY;  Surgeon: Elam Dutch, MD;  Location: [employer];  Service: Vascular;  Laterality: Left;  . EXCISION/RELEASE BURSA HIP Right 11/23/2012   Procedure: RIGHT HIP BURSECTOMY AND TENDON REPAIR;  Surgeon: Gearlean Alf, MD;  [facility];  Service: Orthopedics;  Laterality: Right;  . EYE SURGERY  2012   Bilateral cataract removal  . FOOT SURGERY     Right foot hammer toe surgery  . INNER EAR SURGERY    . JOINT REPLACEMENT  05/2011   Left knee replacement  . TONSILLECTOMY      Current Meds  Medication Sig  . ADVAIR DISKUS 250-50 MCG/DOSE AEPB Inhale 1 puff into the lungs as needed.  Marland Kitchen albuterol-ipratropium (COMBIVENT) 18-103 MCG/ACT inhaler Inhale 2 puffs into the lungs every 4 (four) hours as needed for shortness of breath. Reported on 11/15/2015  . amoxicillin (AMOXIL) 500 MG capsule Take four tablets prior to dental procedures  . atorvastatin (LIPITOR) 10 MG tablet Take 10 mg by mouth at bedtime.   . Biotin 10 MG TABS Take 1 tablet by mouth daily.  . Cyanocobalamin 1000 MCG/ML KIT Inject 2 mLs as directed every 30 (thirty) days.  . fesoterodine (TOVIAZ) 4 MG TB24 tablet Take 4 mg by mouth daily.   . meclizine (ANTIVERT) 25 MG tablet Take 25 mg by mouth 3 (three) times daily as needed.   . mirtazapine (REMERON) 15 MG tablet Take 15 mg by mouth at bedtime.  . montelukast (SINGULAIR) 10 MG tablet Take 10 mg by mouth at bedtime.  . nitroGLYCERIN (NITROSTAT) 0.4 MG SL tablet Place 0.4 mg under the tongue every 5 (five) minutes as needed for chest pain.   . Oxycodone HCl 10 MG TABS Take 1 tablet (10 mg total) by  mouth every 4 (four) hours as needed.  . pilocarpine (SALAGEN) 5 MG tablet Take 1 tablet by mouth daily.  . polyethylene glycol (MIRALAX / GLYCOLAX) packet Take 17 g by mouth daily as needed (constipation). Reported on 11/15/2015  . potassium chloride SA (K-DUR,KLOR-CON) 20 MEQ tablet Take 20 mEq by mouth 2 (two) times daily.  Marland Kitchen rOPINIRole (REQUIP) 1 MG tablet Take 2 mg by mouth at bedtime.   . torsemide (DEMADEX) 20 MG tablet Take 1 tablet (20 mg total) by mouth daily as needed. Swelling, weight gain of 3 lbs in day, or 5 lbs in one week.  . Vitamin D, Ergocalciferol, (DRISDOL) 50000 units CAPS capsule Take 50,000 Units by mouth every 7 (seven) days. Sunday  . zolpidem (AMBIEN) 10 MG tablet Take 10 mg by mouth at bedtime.  12 system ROS was negative unless otherwise noted in HPI   Observations/Objective: Carotid Duplex (03-23-19): Right Carotid: Velocities in the right ICA are consistent with a 40-59% stenosis. Left Carotid: There is no evidence of stenosis in the left ICA. Vertebrals:  Bilateral vertebral arteries demonstrate antegrade flow. Subclavians: Normal flow hemodynamics were seen in bilateral subclavian arteries. No significant change compared to the exam on 02-03-18.     Assessment and Plan: [name] is a 85 y.o. female who is s/p left carotid endarterectomy May 2017 by Dr. Oneida Alar. She has a hx of right carotid artery stenting at [facility] in the 1980's.   She has no hx of stroke or TIA.  Carotid duplex today remains stable compared to 02-03-18: 40-59% right ICA stenosis and no stenosis in the left ICA.   She quit smoking in 1966 and does not have DM. She takes a daily statin and ASA.    Follow Up Instructions:   Follow up 1 year with carotid duplex.    I discussed the assessment and treatment plan with the patient. The patient was provided an opportunity to ask questions and all were answered. The patient agreed with the plan and demonstrated an  understanding of the instructions.   The patient was advised to call back or seek an in-person evaluation if the symptoms worsen or if the condition fails to improve as anticipated.  I spent 15 minutes with the patient via telephone encounter.   Gabrielle Dare [facility] Office: [phone]

## 2019-03-23 NOTE — Telephone Encounter (Signed)
Virtual Visit Pre-Appointment Phone Call  Today, I spoke with [name] and performed the following actions:  1. I explained that we are currently trying to limit exposure to the COVID-19 virus by seeing patients at home rather than in the office.  I explained that the visits are best done by video, but can be done by telephone.  I asked the patient if a virtual visit that the patient would like to try instead of coming into the office. [name] agreed to proceed with the virtual visit scheduled with [name] NP on 03/23/19.     2. I confirmed the BEST phone number to call the day of the visit and- I included this in appointment notes.  3. I asked if the patient had access to (through a family member/friend) a smartphone with video capability to be used for her visit?"  The patient said yes -    4. I confirmed consent by  a. sending through [place] or by email the [place] as written at the end of this message or  b. verbally as listed below. i. This visit is being performed in the setting of COVID-19. ii. All virtual visits are billed to your insurance company just like a normal visit would be.   iii. We'd like you to understand that the technology does not allow for your provider to perform an examination, and thus may limit your provider's ability to fully assess your condition.  iv. If your provider identifies any concerns that need to be evaluated in person, we Harris make arrangements to do so.   v. Finally, though the technology is pretty good, we cannot assure that it Harris always work on either your or our end, and in the setting of a video visit, we may have to convert it to a phone-only visit.  In either situation, we cannot ensure that we have a secure connection.   vi. Are you willing to proceed?"  STAFF: Did the patient verbally acknowledge consent to telehealth visit? Document YES/NO here: YES  2. I advised the patient to  be prepared - I asked that the patient, on the day of her visit, record any information possible with the equipment at her home, such as blood pressure, pulse, oxygen saturation, and your weight and write them all down. I asked the patient to have a pen and paper handy nearby the day of the visit as well.  3. If the patient was scheduled for a video visit, I informed the patient that the visit with the doctor would start with a text to the smartphone # given to [place] by the patient.         If the patient was scheduled for a telephone call, I informed the patient that the visit with the doctor would start with a call to the telephone # given to [place] by the patient.  4. I Informed patient they Harris receive a phone call 15 minutes prior to their appointment time from a [place] or nurse to review medications, allergies, etc. to prepare for the visit.    TELEPHONE CALL NOTE  [name] has been deemed a candidate for a follow-up tele-health visit to limit community exposure during the Covid-19 pandemic. I spoke with the patient via phone to ensure availability of phone/video source, confirm preferred email & phone number, and discuss instructions and expectations.  I reminded [name] to be prepared with any vital sign  and/or heart rhythm information that could potentially be obtained via home monitoring, at the time of her visit. I reminded [name] to expect a phone call prior to her visit.  Cleaster Corin, NT 03/23/2019 4:11 PM     FULL LENGTH CONSENT FOR TELE-HEALTH VISIT   I hereby voluntarily request, consent and authorize [facility] and its employed or contracted physicians, physician assistants, nurse practitioners or other licensed health care professionals (the Practitioner), to provide me with telemedicine health care services (the "Services") as deemed necessary by the treating Practitioner. I acknowledge and consent to receive the Services by the Practitioner via  telemedicine. I understand that the telemedicine visit Harris involve communicating with the Practitioner through live audiovisual communication technology and the disclosure of certain medical information by electronic transmission. I acknowledge that I have been given the opportunity to request an in-person assessment or other available alternative prior to the telemedicine visit and am voluntarily participating in the telemedicine visit.  I understand that I have the right to withhold or withdraw my consent to the use of telemedicine in the course of my care at any time, without affecting my right to future care or treatment, and that the Practitioner or I may terminate the telemedicine visit at any time. I understand that I have the right to inspect all information obtained and/or recorded in the course of the telemedicine visit and may receive copies of available information for a reasonable fee.  I understand that some of the potential risks of receiving the Services via telemedicine include:  Marland Kitchen Delay or interruption in medical evaluation due to technological equipment failure or disruption; . Information transmitted may not be sufficient (e.g. poor resolution of images) to allow for appropriate medical decision making by the Practitioner; and/or  . In rare instances, security protocols could fail, causing a breach of personal health information.  Furthermore, I acknowledge that it is my responsibility to provide information about my medical history, conditions and care that is complete and accurate to the best of my ability. I acknowledge that Practitioner's advice, recommendations, and/or decision may be based on factors not within their control, such as incomplete or inaccurate data provided by me or distortions of diagnostic images or specimens that may result from electronic transmissions. I understand that the practice of medicine is not an exact science and that Practitioner makes no warranties or  guarantees regarding treatment outcomes. I acknowledge that I Harris receive a copy of this consent concurrently upon execution via email to the email address I last provided but may also request a printed copy by calling the office of [facility].    I understand that my insurance Harris be billed for this visit.   I have read or had this consent read to me. . I understand the contents of this consent, which adequately explains the benefits and risks of the Services being provided via telemedicine.  . I have been provided ample opportunity to ask questions regarding this consent and the Services and have had my questions answered to my satisfaction. . I give my informed consent for the services to be provided through the use of telemedicine in my medical care  By participating in this telemedicine visit I agree to the above.

## 2019-05-03 ENCOUNTER — Ambulatory Visit: Payer: Medicare Other | Admitting: Adult Health

## 2019-11-18 DIAGNOSIS — R112 Nausea with vomiting, unspecified: Secondary | ICD-10-CM | POA: Insufficient documentation

## 2019-11-23 DIAGNOSIS — U071 COVID-19: Secondary | ICD-10-CM | POA: Insufficient documentation

## 2019-11-24 DIAGNOSIS — B370 Candidal stomatitis: Secondary | ICD-10-CM | POA: Insufficient documentation

## 2020-09-13 DIAGNOSIS — H8093 Unspecified otosclerosis, bilateral: Secondary | ICD-10-CM | POA: Insufficient documentation

## 2020-09-13 DIAGNOSIS — H906 Mixed conductive and sensorineural hearing loss, bilateral: Secondary | ICD-10-CM | POA: Insufficient documentation

## 2020-09-13 DIAGNOSIS — Z9009 Acquired absence of other part of head and neck: Secondary | ICD-10-CM | POA: Insufficient documentation

## 2020-09-17 ENCOUNTER — Other Ambulatory Visit: Payer: Self-pay

## 2020-09-17 DIAGNOSIS — I6523 Occlusion and stenosis of bilateral carotid arteries: Secondary | ICD-10-CM

## 2020-09-25 ENCOUNTER — Ambulatory Visit (HOSPITAL_COMMUNITY)
Admission: RE | Admit: 2020-09-25 | Discharge: 2020-09-25 | Disposition: A | Discharge status: Home / Self Care | Payer: Medicare Other | Source: Ambulatory Visit | Attending: Vascular Surgery | Admitting: Vascular Surgery

## 2020-09-25 ENCOUNTER — Ambulatory Visit (INDEPENDENT_AMBULATORY_CARE_PROVIDER_SITE_OTHER): Payer: Medicare Other | Admitting: Physician Assistant

## 2020-09-25 ENCOUNTER — Other Ambulatory Visit: Payer: Self-pay

## 2020-09-25 VITALS — BP 136/67 | HR 46 | Temp 98.2°F | Resp 20 | Ht 60.0 in | Wt 141.3 lb

## 2020-09-25 DIAGNOSIS — I6523 Occlusion and stenosis of bilateral carotid arteries: Secondary | ICD-10-CM | POA: Diagnosis not present

## 2020-09-25 NOTE — Progress Notes (Signed)
HISTORY AND PHYSICAL     CC:  follow up. Requesting Provider:  Gennette Pac, NP  HPI: This is a 87 y.o. female here for follow up for carotid artery stenosis.  Pt is s/p left CEA for asymptomatic carotid artery stenosis on 12/11/2015 by Dr. Oneida Alar.  She has hx of right carotid artery stenting in the 1980's in [place].    Pt was last seen August 2020 and at that time she remained asymptomatic.  In the past, the pt did have dizziness but was reassured this is not usually related to carotid stenosis unless there is high grade stenosis of both vertebrals and carotid arteries.  She was having coolness of the left foot but had normal ABI and a palpable left DP pulse and it was felt this was more likely related to neuropathy.    She has hx of arrhythmia and is followed by Dr. Harrell Gave.    Pt returns today for follow up.  She states she has been doing well.  She continues to have some dizziness that is really unchanged from before.  She states that she was at the ear doctor a couple of weeks ago and he listened to her neck and was very concerned and told her she needed follow up immediately.  She is here accompanied by her granddaughter.    Pt denies any amaurosis fugax, speech difficulties, weakness, numbness, paralysis or clumsiness or facial droop.  She is very independent and lives with her granddaughter.  The pt is on a statin for cholesterol management.  The pt is on a daily aspirin.   Other AC:  none The pt is on ARB for hypertension.   The pt is not diabetic.   Tobacco hx:  former  Pt does not have family hx of AAA.  Past Medical History:  Diagnosis Date  . Anemia    hx of. None since hysterectomy  . Arthritis   . Asthma   . Bronchitis    hx of  . CAD (coronary artery disease)   . Cancer ([place])    skin cancer on right hand  . Carotid artery occlusion   . Complication of anesthesia    Blood pressure dropped after hammertoe surgery; difficulty waking up  . COPD (chronic  obstructive pulmonary disease) ([place])   . Coronary artery disease    prox LAD stent '06  . Enlarged heart   . GERD (gastroesophageal reflux disease)   . HOH (hard of hearing)    severe hearing loss- Wears Bilateral hearing aids  . Hypercholesteremia   . Hypertension   . Peripheral vascular disease ([place])   . Pneumonia    hx of  . Restless leg syndrome   . Shortness of breath    With ambulation at times  . Syncope 2009   Has not had another episode since 2009  . Urination frequency    excessive at bedtime  . Vertigo    hx of    Past Surgical History:  Procedure Laterality Date  . ABDOMINAL HYSTERECTOMY    . ANTERIOR LAT LUMBAR FUSION  04/06/2012   Procedure: ANTERIOR LATERAL LUMBAR FUSION 1 LEVEL;  Surgeon: Melina Schools, MD;  Location: [facility];  Service: Orthopedics;  Laterality: N/A;  XLIF L3-L4 with Posterior Instrumentation   . APPENDECTOMY    . BACK SURGERY    . CARDIAC CATHETERIZATION    . CHOLECYSTECTOMY    . COLONOSCOPY    . CORONARY ANGIOPLASTY  2006   Prox LAD  .  ENDARTERECTOMY Left 12/11/2015   Procedure: LEFT CAROTID ENDARTERECTOMY WITH PATCH ANGIOPLASTY;  Surgeon: Elam Dutch, MD;  Location: [facility];  Service: Vascular;  Laterality: Left;  . EXCISION/RELEASE BURSA HIP Right 11/23/2012   Procedure: RIGHT HIP BURSECTOMY AND TENDON REPAIR;  Surgeon: Gearlean Alf, MD;  Location: [facility];  Service: Orthopedics;  Laterality: Right;  . EYE SURGERY  2012   Bilateral cataract removal  . FOOT SURGERY     Right foot hammer toe surgery  . INNER EAR SURGERY    . JOINT REPLACEMENT  05/2011   Left knee replacement  . TONSILLECTOMY      Allergies  Allergen Reactions  . Fluconazole Rash and Swelling    Lower extremity swelling and rash on legs.  Lebron Quam [Hydrocodone-Acetaminophen] Other (See Comments)    Patient states allergy to Hydrocodone. "Jittery, doesn't work."  . Pineapple Other (See Comments)    Mouth stings and burns  . Sulfa Antibiotics Swelling  . Latex  Rash    Current Outpatient Medications  Medication Sig Dispense Refill  . ADVAIR DISKUS 250-50 MCG/DOSE AEPB Inhale 1 puff into the lungs as needed.    Marland Kitchen albuterol-ipratropium (COMBIVENT) 18-103 MCG/ACT inhaler Inhale 2 puffs into the lungs every 4 (four) hours as needed for shortness of breath. Reported on 11/15/2015    . amLODipine (NORVASC) 5 MG tablet Take 5 mg by mouth.    Marland Kitchen amoxicillin (AMOXIL) 500 MG capsule Take four tablets prior to dental procedures    . atorvastatin (LIPITOR) 10 MG tablet Take 10 mg by mouth at bedtime.     . Biotin 10 MG TABS Take 1 tablet by mouth daily.    . Cyanocobalamin 1000 MCG/ML KIT Inject 2 mLs as directed every 30 (thirty) days.    . fesoterodine (TOVIAZ) 4 MG TB24 tablet Take 4 mg by mouth daily.     . fluticasone (FLONASE) 50 MCG/ACT nasal spray Place 1 spray into both nostrils daily.     . meclizine (ANTIVERT) 25 MG tablet Take 25 mg by mouth 3 (three) times daily as needed.     . mirtazapine (REMERON) 15 MG tablet Take 15 mg by mouth at bedtime.    . montelukast (SINGULAIR) 10 MG tablet Take 10 mg by mouth at bedtime.    . nitroGLYCERIN (NITROSTAT) 0.4 MG SL tablet Place 0.4 mg under the tongue every 5 (five) minutes as needed for chest pain.     Marland Kitchen omeprazole (PRILOSEC) 20 MG capsule Take 20 mg by mouth 2 (two) times daily before a meal.     . Oxycodone HCl 10 MG TABS Take 1 tablet (10 mg total) by mouth every 4 (four) hours as needed. 15 tablet 0  . pilocarpine (SALAGEN) 5 MG tablet Take 1 tablet by mouth daily.    . polyethylene glycol (MIRALAX / GLYCOLAX) packet Take 17 g by mouth daily as needed (constipation). Reported on 11/15/2015    . potassium chloride SA (K-DUR,KLOR-CON) 20 MEQ tablet Take 20 mEq by mouth 2 (two) times daily.    Marland Kitchen rOPINIRole (REQUIP) 1 MG tablet Take 2 mg by mouth at bedtime.     . torsemide (DEMADEX) 20 MG tablet Take 1 tablet (20 mg total) by mouth daily as needed. Swelling, weight gain of 3 lbs in day, or 5 lbs in one week.  30 tablet 11  . Vitamin D, Ergocalciferol, (DRISDOL) 50000 units CAPS capsule Take 50,000 Units by mouth every 7 (seven) days. Sunday    . zolpidem (  AMBIEN) 10 MG tablet Take 10 mg by mouth at bedtime.      No current facility-administered medications for this visit.    Family History  Problem Relation Age of Onset  . Heart disease Sister        before age 35  . Heart disease Son        before age 63    Social History   Socioeconomic History  . Marital status: Widowed    Spouse name: Not on file  . Number of children: Not on file  . Years of education: Not on file  . Highest education level: Not on file  Occupational History  . Not on file  Tobacco Use  . Smoking status: Former Smoker    Types: Cigarettes    Quit date: 11/15/1964    Years since quitting: 55.8  . Smokeless tobacco: Never Used  Vaping Use  . Vaping Use: Never used  Substance and Sexual Activity  . Alcohol use: No  . Drug use: No  . Sexual activity: Not Currently  Other Topics Concern  . Not on file  Social History Narrative  . Not on file   Social Determinants of Health   Financial Resource Strain: Not on file  Food Insecurity: Not on file  Transportation Needs: Not on file  Physical Activity: Not on file  Stress: Not on file  Social Connections: Not on file  Intimate Partner Violence: Not on file     REVIEW OF SYSTEMS:   [X]  denotes positive finding, [ ]  denotes negative finding Cardiac  Comments:  Chest pain or chest pressure:    Shortness of breath upon exertion: x   Short of breath when lying flat:    Irregular heart rhythm:        Vascular    Pain in calf, thigh, or hip brought on by ambulation:    Pain in feet at night that wakes you up from your sleep:     Blood clot in your veins:    Leg swelling:         Pulmonary    Oxygen at home:    Productive cough:     Wheezing:         Neurologic    Sudden weakness in arms or legs:     Sudden numbness in arms or legs:     Sudden  onset of difficulty speaking or slurred speech:    Temporary loss of vision in one eye:     Problems with dizziness:         Gastrointestinal    Blood in stool:     Vomited blood:         Genitourinary    Burning when urinating:     Blood in urine:        Psychiatric    Major depression:         Hematologic    Bleeding problems:    Problems with blood clotting too easily:        Skin    Rashes or ulcers:        Constitutional    Fever or chills:      PHYSICAL EXAMINATION:  Today's Vitals   09/25/20 1121 09/25/20 1123  BP: (!) 147/65 136/67  Pulse: (!) 46   Resp: 20   Temp: 98.2 F (36.8 C)   TempSrc: Temporal   SpO2: 98%   Weight: 141 lb 4.8 oz (64.1 kg)   Height: 5' (1.524 m)  Body mass index is 27.6 kg/m.   General:  WDWN in NAD; vital signs documented above Gait: with a cane HENT: WNL, normocephalic Pulmonary: normal non-labored breathing Cardiac: regular HR, without carotid bruits Abdomen: soft, NT, no masses; aortic pulse is not palpable Skin: without rashes Vascular Exam/Pulses:  Right Left  Radial 2+ (normal) 2+ (normal)  Ulnar Unable to palpate Unable to palpate  DP 2+ (normal) 2+ (normal)  PT Unable to palpate Unable to palpate   Extremities: without ischemic changes, without Gangrene , without cellulitis; without open wounds Musculoskeletal: no muscle wasting or atrophy  Neurologic: A&O X 3 Psychiatric:  The pt has Normal affect.   Non-Invasive Vascular Imaging:   Carotid Duplex on 09/25/2020: Right:  40-59% ICA stenosis Left:  1-39% ICA stenosis -Right Carotid: Velocities in the right ICA are consistent with a 40-59%  stenosis.  -Left Carotid: Velocities in the left ICA are consistent with a 1-39%  stenosis.  -Vertebrals: Bilateral vertebral arteries demonstrate antegrade flow.  -Subclavians: Bilateral subclavian arteries were stenotic.  Previous Carotid duplex on 03/23/2019: Right: 40-59% ICA stenosis Left:    normal Vertebrals: Bilateral vertebral arteries demonstrate antegrade flow.  Subclavians: Normal flow hemodynamics were seen in bilateral subclavian arteries.   ASSESSMENT/PLAN:: 87 y.o. female here for follow up carotid artery stenosis with hx of left CEA for asymptomatic carotid artery stenosis on 12/11/2015 by Dr. Oneida Alar.  She has hx of right carotid artery stenting in the 1980's in [facility].    -duplex today reveals her carotid stenosis is essentially unchanged and she remains asymptomatic.  She did have some stenosis of the subclavian arteries but she has bounding bilateral palpable radial pulses.  -discussed s/s of stroke with pt and she understands should she develop any of these sx, she will go to the nearest ER. -pt will f/u in one year with carotid duplex -pt will call sooner should they have any issues.   Leontine Locket, [facility] [phone]  Clinic MD:  Oneida Alar

## 2021-01-24 ENCOUNTER — Ambulatory Visit: Payer: Medicare Other | Admitting: General Practice
